# Patient Record
Sex: Male | Born: 1965 | Race: White | Hispanic: No | Marital: Married | Smoking: Current every day smoker
Health system: Southern US, Community
[De-identification: ages and names within clinical notes are randomized; demographics above are authoritative.]

## PROBLEM LIST (undated history)

## (undated) DIAGNOSIS — F329 Major depressive disorder, single episode, unspecified: Secondary | ICD-10-CM

## (undated) DIAGNOSIS — F1010 Alcohol abuse, uncomplicated: Secondary | ICD-10-CM

## (undated) DIAGNOSIS — R768 Other specified abnormal immunological findings in serum: Secondary | ICD-10-CM

## (undated) DIAGNOSIS — K219 Gastro-esophageal reflux disease without esophagitis: Secondary | ICD-10-CM

## (undated) DIAGNOSIS — F32A Depression, unspecified: Secondary | ICD-10-CM

## (undated) DIAGNOSIS — D126 Benign neoplasm of colon, unspecified: Secondary | ICD-10-CM

## (undated) DIAGNOSIS — Z800 Family history of malignant neoplasm of digestive organs: Secondary | ICD-10-CM

## (undated) HISTORY — DX: Other specified abnormal immunological findings in serum: R76.8

## (undated) HISTORY — DX: Family history of malignant neoplasm of digestive organs: Z80.0

## (undated) HISTORY — DX: Benign neoplasm of colon, unspecified: D12.6

## (undated) HISTORY — DX: Alcohol abuse, uncomplicated: F10.10

## (undated) HISTORY — PX: POLYPECTOMY: SHX149

## (undated) HISTORY — DX: Depression, unspecified: F32.A

## (undated) HISTORY — DX: Major depressive disorder, single episode, unspecified: F32.9

## (undated) HISTORY — PX: COLONOSCOPY: SHX174

---

## 1988-08-07 HISTORY — PX: APPENDECTOMY: SHX54

## 1989-08-07 HISTORY — PX: VASECTOMY: SHX75

## 2001-05-11 ENCOUNTER — Inpatient Hospital Stay (HOSPITAL_COMMUNITY): Admission: AC | Admit: 2001-05-11 | Discharge: 2001-05-13 | Payer: Self-pay

## 2001-05-11 ENCOUNTER — Encounter: Payer: Self-pay | Admitting: Emergency Medicine

## 2002-08-24 ENCOUNTER — Emergency Department (HOSPITAL_COMMUNITY): Admission: EM | Admit: 2002-08-24 | Discharge: 2002-08-25 | Payer: Self-pay | Admitting: *Deleted

## 2004-08-07 DIAGNOSIS — R768 Other specified abnormal immunological findings in serum: Secondary | ICD-10-CM

## 2004-08-07 HISTORY — DX: Other specified abnormal immunological findings in serum: R76.8

## 2006-06-06 ENCOUNTER — Ambulatory Visit: Payer: Self-pay | Admitting: Gastroenterology

## 2006-06-20 ENCOUNTER — Encounter (INDEPENDENT_AMBULATORY_CARE_PROVIDER_SITE_OTHER): Payer: Self-pay | Admitting: Specialist

## 2006-06-20 ENCOUNTER — Ambulatory Visit: Payer: Self-pay | Admitting: Gastroenterology

## 2006-06-22 ENCOUNTER — Ambulatory Visit: Payer: Self-pay | Admitting: Oncology

## 2009-02-23 ENCOUNTER — Ambulatory Visit: Payer: Self-pay | Admitting: Gastroenterology

## 2009-03-09 ENCOUNTER — Encounter: Payer: Self-pay | Admitting: Gastroenterology

## 2009-03-09 ENCOUNTER — Ambulatory Visit: Payer: Self-pay | Admitting: Gastroenterology

## 2009-03-09 HISTORY — PX: COLONOSCOPY W/ POLYPECTOMY: SHX1380

## 2009-03-11 ENCOUNTER — Encounter: Payer: Self-pay | Admitting: Gastroenterology
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
## 2011-06-28 ENCOUNTER — Emergency Department (HOSPITAL_BASED_OUTPATIENT_CLINIC_OR_DEPARTMENT_OTHER)
Admission: EM | Admit: 2011-06-28 | Discharge: 2011-06-28 | Disposition: A | Discharge status: Home / Self Care | Payer: BC Managed Care – PPO | Attending: Emergency Medicine | Admitting: Emergency Medicine

## 2011-06-28 ENCOUNTER — Encounter: Payer: Self-pay | Admitting: *Deleted

## 2011-06-28 DIAGNOSIS — K219 Gastro-esophageal reflux disease without esophagitis: Secondary | ICD-10-CM | POA: Insufficient documentation

## 2011-06-28 DIAGNOSIS — T1500XA Foreign body in cornea, unspecified eye, initial encounter: Secondary | ICD-10-CM | POA: Insufficient documentation

## 2011-06-28 DIAGNOSIS — T1590XA Foreign body on external eye, part unspecified, unspecified eye, initial encounter: Secondary | ICD-10-CM | POA: Insufficient documentation

## 2011-06-28 HISTORY — DX: Gastro-esophageal reflux disease without esophagitis: K21.9

## 2011-06-28 MED ORDER — HYDROCODONE-ACETAMINOPHEN 5-325 MG PO TABS
1.0000 | ORAL_TABLET | Freq: Once | ORAL | Status: AC
  Administered 2011-06-28: 1 via ORAL
  Filled 2011-06-28: qty 1

## 2011-06-28 MED ORDER — FLUORESCEIN SODIUM 1 MG OP STRP
ORAL_STRIP | OPHTHALMIC | Status: AC
  Filled 2011-06-28: qty 1

## 2011-06-28 MED ORDER — ERYTHROMYCIN 5 MG/GM OP OINT
TOPICAL_OINTMENT | Freq: Four times a day (QID) | OPHTHALMIC | Status: DC
  Administered 2011-06-28: 02:00:00 via OPHTHALMIC
  Filled 2011-06-28: qty 3.5

## 2011-06-28 MED ORDER — TETRACAINE HCL 0.5 % OP SOLN
OPHTHALMIC | Status: AC
  Administered 2011-06-28: 1 [drp] via OPHTHALMIC
  Filled 2011-06-28: qty 2

## 2011-06-28 MED ORDER — TETRACAINE HCL 0.5 % OP SOLN
1.0000 [drp] | Freq: Once | OPHTHALMIC | Status: AC
  Administered 2011-06-28: 1 [drp] via OPHTHALMIC

## 2011-06-28 MED ORDER — HYDROCODONE-ACETAMINOPHEN 5-325 MG PO TABS: 1.0000 | ORAL_TABLET | Freq: Four times a day (QID) | ORAL | Status: AC | PRN

## 2011-06-28 NOTE — ED Provider Notes (Addendum)
History     CSN: [id number] Arrival date & time: 06/28/2011  1:22 AM   First MD Initiated Contact with Patient 06/28/11 0127      Chief Complaint  Patient presents with  . Foreign Body in Eye    (Consider location/radiation/quality/duration/timing/severity/associated sxs/prior treatment) HPI Is a 45 year old white male who was grinding yesterday morning and felt a piece of material strike his left eye. He continues to have a foreign body sensation in that eye. There is moderate discomfort. He is here gated the eye without relief. There is no associated erythema but there is tearing. He has had similar injuries in the past.  Past Medical History  Diagnosis Date  . GERD (gastroesophageal reflux disease)     Past Surgical History  Procedure Date  . Appendectomy     History reviewed. No pertinent family history.  History  Substance Use Topics  . Smoking status: Current Everyday Smoker  . Smokeless tobacco: Not on file  . Alcohol Use: No      Review of Systems  All other systems reviewed and are negative.    Allergies  Review of patient's allergies indicates no known allergies.  Home Medications   Current Outpatient Rx  Name Route Sig Dispense Refill  . OMEPRAZOLE 10 MG PO CPDR Oral Take 10 mg by mouth daily.        BP 132/85  Pulse 64  Temp(Src) 97.4 F (36.3 C) (Oral)  Resp 17  SpO2 99%  Physical Exam General: Well-developed, well-nourished male in no acute distress; appearance consistent with age of record HENT: normocephalic, atraumatic Eyes: pupils equal round and reactive to light; extraocular muscles intact; no conjunctival injection; no hyphema; punctate foreign body left lateral cornea; no eyelid edema Neck: supple Heart: regular rate and rhythm Lungs: Normal respiratory effort and excursion Abdomen: soft; nondistended Extremities: No deformity; full range of motion Neurologic: Awake, alert and oriented; motor function intact in all  extremities and symmetric; no facial droop Skin: Warm and dry Psychiatric: Normal mood and affect    ED Course  FOREIGN BODY REMOVAL Date/Time: 06/28/2011 1:48 AM Performed by: Hanley Seamen Authorized by: Hanley Seamen Consent: Verbal consent obtained. Time out: Immediately prior to procedure a "time out" was called to verify the correct patient, procedure, equipment, support staff and site/side marked as required. Body area: eye Location details: left cornea Local anesthetic: tetracaine drops Anesthetic total: 4 drops Patient sedated: no Patient cooperative: yes Localization method: visualized and slit lamp Removal mechanism: moist cotton swab (18-gauge needle) Eye not examined with fluorescein. Residual rust ring present. Dressing: antibiotic ointment Depth: embedded Complexity: simple Objects recovered: speck of foreign matter which fragmented Patient tolerance: Patient tolerated the procedure well with no immediate complications.       MDM  Due to residual rust ring and possibility of small fragment remaining the patient will contact his ophthalmologist in the morning for further evaluation and treatment. In the meantime we'll treat with antibiotic ointment and analgesics.        Hanley Seamen, MD 06/28/11 0151  Hanley Seamen, MD 06/28/11 0157

## 2011-06-28 NOTE — ED Notes (Signed)
C/o foreign body in left eye since Tuesday morning

## 2012-01-19 ENCOUNTER — Ambulatory Visit (INDEPENDENT_AMBULATORY_CARE_PROVIDER_SITE_OTHER): Payer: BC Managed Care – PPO | Admitting: Family Medicine

## 2012-01-19 ENCOUNTER — Encounter: Payer: Self-pay | Admitting: Family Medicine

## 2012-01-19 VITALS — BP 120/80 | HR 57 | Temp 97.5°F | Wt 179.0 lb

## 2012-01-19 DIAGNOSIS — M791 Myalgia, unspecified site: Secondary | ICD-10-CM

## 2012-01-19 DIAGNOSIS — R5383 Other fatigue: Secondary | ICD-10-CM

## 2012-01-19 DIAGNOSIS — IMO0001 Reserved for inherently not codable concepts without codable children: Secondary | ICD-10-CM

## 2012-01-19 DIAGNOSIS — R358 Other polyuria: Secondary | ICD-10-CM

## 2012-01-19 DIAGNOSIS — F32A Depression, unspecified: Secondary | ICD-10-CM | POA: Insufficient documentation

## 2012-01-19 DIAGNOSIS — F329 Major depressive disorder, single episode, unspecified: Secondary | ICD-10-CM

## 2012-01-19 DIAGNOSIS — R3589 Other polyuria: Secondary | ICD-10-CM

## 2012-01-19 LAB — COMPREHENSIVE METABOLIC PANEL
ALT: 18 U/L (ref 0–53)
AST: 16 U/L (ref 0–37)
Albumin: 4.5 g/dL (ref 3.5–5.2)
Alkaline Phosphatase: 56 U/L (ref 39–117)
Glucose, Bld: 94 mg/dL (ref 70–99)
Potassium: 4.6 mEq/L (ref 3.5–5.3)
Sodium: 139 mEq/L (ref 135–145)
Total Bilirubin: 1.3 mg/dL — ABNORMAL HIGH (ref 0.3–1.2)
Total Protein: 6.6 g/dL (ref 6.0–8.3)

## 2012-01-19 LAB — TSH: TSH: 1.392 u[IU]/mL (ref 0.350–4.500)

## 2012-01-19 LAB — CK: Total CK: 92 U/L (ref 7–232)

## 2012-01-19 LAB — LIPID PANEL
Total CHOL/HDL Ratio: 5.3 Ratio
VLDL: 15 mg/dL (ref 0–40)

## 2012-01-19 LAB — CBC WITH DIFFERENTIAL/PLATELET
Basophils Relative: 1 % (ref 0–1)
Eosinophils Absolute: 0.1 10*3/uL (ref 0.0–0.7)
Eosinophils Relative: 2 % (ref 0–5)
Lymphs Abs: 2.8 10*3/uL (ref 0.7–4.0)
MCH: 30.7 pg (ref 26.0–34.0)
MCHC: 35.4 g/dL (ref 30.0–36.0)
MCV: 86.9 fL (ref 78.0–100.0)
Neutrophils Relative %: 56 % (ref 43–77)
Platelets: 267 10*3/uL (ref 150–400)
RBC: 5.5 MIL/uL (ref 4.22–5.81)

## 2012-01-19 NOTE — Assessment & Plan Note (Signed)
Sounds more like dysthymia.  He admits that much of his alcohol abuse in the past was when he was depressed and attempting to self medicate, even though he knew it would only make things worse. We'll recheck him in a week and review all labs and will address possible treatment for depression at that time.

## 2012-01-19 NOTE — Assessment & Plan Note (Signed)
Will eval for hypothyroidism, but I think these symptoms are somatic symptoms of his underlying depression. Some general labs will be done: ANA, CPK total, ESR, CBC, CMET, TSH, and fasting lipid panel.

## 2012-01-19 NOTE — Progress Notes (Signed)
Office Note 01/19/2012  CC:  Chief Complaint  Patient presents with  . Establish Care    muscle aches, legs/arms go numb during day    HPI:  Blake Taylor is a 46 y.o. White male who is here to establish care. Patient's most recent primary MD: Brooks Sailors in [employer]. Old records were reviewed (GI records in EPIC/HL) prior to or during today's visit.  Pt presents with his wife today, complains of about 8 yrs of aching in his muscles in arms and legs.  No neck pain or back pain.  No rash.  No headaches.  Says the pains are not constant, but they occur "often" and enough bother him quite a bit, although he says this is the first time he has seen a doctor for this problem.  Says he feels arms "go to sleep" when he has them folded/crossed over his chest sometimes.  Denies any pain in joints or swelling/redness of joints.  Says arms and legs feel cold much of the time.  Denies color change in extremities.   Admits to lifetime tendency towards feeling depressed "on and off".  Often feels no motivation to get up and do anything, mild irritability, some isolating behavior.  No crying spells, no concentration problems, no sleep or appetite problems.  One past attempt at treating depression was not successful and he recalls the med made him feel bad.  Past Medical History  Diagnosis Date  . GERD (gastroesophageal reflux disease)     prilosec helpful  . Adenomatous colon polyp   . Family history of colon cancer   . Depression     trial of zoloft in the past--didn't help and caused stomach ache    Past Surgical History  Procedure Date  . Appendectomy 1990  . Vasectomy 1991  . Colonoscopy w/ polypectomy     History reviewed. No pertinent family history.  History   Social History  . Marital Status: Married    Spouse Name: N/A    Number of Children: N/A  . Years of Education: N/A   Occupational History  . Not on file.   Social History Main Topics  . Smoking status: Current  Everyday Smoker  . Smokeless tobacco: Not on file  . Alcohol Use: No  . Drug Use: No  . Sexually Active:    Other Topics Concern  . Not on file   Social History Narrative   Married, 4 daughters live local.Occupation: maintenance work, temporary basis right now.Tobacco 30 pack-yr hx.  Alcohol only occasionally now, hx of alcohol abuse.  No hx of drug abuse.No exercising.  Typical american diet.    Outpatient Encounter Prescriptions as of 01/19/2012  Medication Sig Dispense Refill  . omeprazole (PRILOSEC) 10 MG capsule Take 10 mg by mouth daily.          No Known Allergies  ROS Review of Systems  Constitutional: Negative for fever, chills, appetite change and fatigue.  HENT: Negative for ear pain, congestion, sore throat, neck stiffness and dental problem.   Eyes: Negative for discharge, redness and visual disturbance.  Respiratory: Negative for cough, chest tightness, shortness of breath and wheezing.   Cardiovascular: Negative for chest pain, palpitations and leg swelling.  Gastrointestinal: Negative for nausea, vomiting, abdominal pain, diarrhea and blood in stool.  Genitourinary: Positive for urgency (for years) and frequency (for years). Negative for dysuria, hematuria, flank pain and difficulty urinating.  Musculoskeletal: Negative for back pain, joint swelling and arthralgias.  Skin: Negative for pallor and  rash.  Neurological: Negative for dizziness, speech difficulty, weakness and headaches.  Hematological: Negative for adenopathy. Does not bruise/bleed easily.  Psychiatric/Behavioral: Negative for confusion and disturbed wake/sleep cycle. The patient is not nervous/anxious.        See HPI    PE; Blood pressure 120/80, pulse 57, temperature 97.5 F (36.4 C), temperature source Temporal, weight 179 lb (81.194 kg), SpO2 96.00%. Gen: Alert, well appearing.  Patient is oriented to person, place, time, and situation. Affect: flat, only makes eye contact with me infrequently,  wife did most of the talking initially until he relaxed a bit.  Thought and speech are lucid. ENT: Ears: EACs clear, normal epithelium.  TMs with good light reflex and landmarks bilaterally.  Eyes: no injection, icteris, swelling, or exudate.  EOMI, PERRLA. Nose: no drainage or turbinate edema/swelling.  No injection or focal lesion.  Mouth: lips without lesion/swelling.  Oral mucosa pink and moist.  Dentition intact and without obvious caries or gingival swelling.  Oropharynx without erythema, exudate, or swelling.  Neck: supple/nontender.  No LAD, mass, or TM.  Carotid pulses 2+ bilaterally, without bruits. CV: RRR, no m/r/g.   LUNGS: CTA bilat, nonlabored resps, good aeration in all lung fields. ABD: soft, NT, ND, BS normal.  No hepatospenomegaly or mass.  No bruits. EXT: no clubbing, cyanosis, or edema.  No muscle tenderness to squeezing/palpation. Musculoskeletal: no joint swelling, erythema, warmth, or tenderness.  ROM of all joints intact. Neuro: CN 2-12 intact bilaterally, strength 5/5 in proximal and distal upper extremities and lower extremities bilaterally.  No sensory deficits.  No tremor.  No disdiadochokinesis.  No ataxia.  Upper extremity and lower extremity DTRs symmetric.  No pronator drift.  Pertinent labs:  none  ASSESSMENT AND PLAN:   New pt: obtain old records.  Muscle pain Will eval for hypothyroidism, but I think these symptoms are somatic symptoms of his underlying depression. Some general labs will be done: ANA, CPK total, ESR, CBC, CMET, TSH, and fasting lipid panel.  Depression Sounds more like dysthymia.  He admits that much of his alcohol abuse in the past was when he was depressed and attempting to self medicate, even though he knew it would only make things worse. We'll recheck him in a week and review all labs and will address possible treatment for depression at that time.  I think his urinary frequency and urgency are possibly slight overactive bladder that  is complicated by too much caffeine intake.  Recommended decreasing caffeine intake slowly and see how symptoms improve.  Return in about 1 week (around 01/26/2012) for f/u myalgias and depression.

## 2012-01-20 LAB — SEDIMENTATION RATE: Sed Rate: 1 mm/hr (ref 0–16)

## 2012-01-22 LAB — ANA: Anti Nuclear Antibody(ANA): NEGATIVE

## 2012-01-26 ENCOUNTER — Encounter: Payer: Self-pay | Admitting: Family Medicine

## 2012-01-26 ENCOUNTER — Ambulatory Visit (INDEPENDENT_AMBULATORY_CARE_PROVIDER_SITE_OTHER): Payer: BC Managed Care – PPO | Admitting: Family Medicine

## 2012-01-26 VITALS — BP 114/75 | HR 65 | Ht 66.0 in | Wt 177.0 lb

## 2012-01-26 DIAGNOSIS — F32A Depression, unspecified: Secondary | ICD-10-CM

## 2012-01-26 DIAGNOSIS — F329 Major depressive disorder, single episode, unspecified: Secondary | ICD-10-CM

## 2012-01-26 MED ORDER — BUPROPION HCL ER (XL) 150 MG PO TB24: 150.0000 mg | ORAL_TABLET | Freq: Every day | ORAL | Status: DC

## 2012-01-26 NOTE — Assessment & Plan Note (Signed)
I think this is the root of his myalgias--somatic complaints. Will start trial of wellbutrin XL 150mg  qAM.  Therapeutic expectations and side effect profile of medication discussed today.  Patient's questions answered. F/u 1 mo.

## 2012-01-26 NOTE — Progress Notes (Signed)
OFFICE NOTE  01/26/2012  CC:  Chief Complaint  Patient presents with  . Follow-up    myalgias-pain is the same, still hurts; arms sore with activity, muscles ache     HPI: Patient is a 46 y.o. Caucasian male who is here for 1 wk f/u for myalgias and suspicion of depression. All recent blood tests for inflammitory conditions that may cause this were negative/normal. Says energy level is down, motivation not there, concentration ok, sleep is fine, no ED or libido problems, no suicidal thinking, tends to isolate himself, feels emotionally flat.  No signif anxiety, no panic.   Pertinent PMH:  Past Medical History  Diagnosis Date  . GERD (gastroesophageal reflux disease)     prilosec helpful  . Adenomatous colon polyp   . Family history of colon cancer   . Depression     trial of zoloft in the past--didn't help and caused stomach ache    MEDS:  Outpatient Prescriptions Prior to Visit  Medication Sig Dispense Refill  . omeprazole (PRILOSEC) 10 MG capsule Take 10 mg by mouth daily.          PE: Blood pressure 114/75, pulse 65, height 5\' 6"  (1.676 m), weight 177 lb (80.287 kg). Gen: Alert, well appearing.  Patient is oriented to person, place, time, and situation. CV: RRR, no m/r/g Neuro: no tremor  IMPRESSION AND PLAN:  Depression I think this is the root of his myalgias--somatic complaints. Will start trial of wellbutrin XL 150mg  qAM.  Therapeutic expectations and side effect profile of medication discussed today.  Patient's questions answered. F/u 1 mo.     FOLLOW UP: 1 mo

## 2012-02-05 ENCOUNTER — Encounter: Payer: Self-pay | Admitting: Family Medicine

## 2012-02-26 ENCOUNTER — Encounter: Payer: Self-pay | Admitting: Family Medicine

## 2012-02-26 ENCOUNTER — Ambulatory Visit (INDEPENDENT_AMBULATORY_CARE_PROVIDER_SITE_OTHER): Payer: BC Managed Care – PPO | Admitting: Family Medicine

## 2012-02-26 VITALS — BP 121/79 | HR 70 | Ht 66.0 in | Wt 177.0 lb

## 2012-02-26 DIAGNOSIS — N451 Epididymitis: Secondary | ICD-10-CM | POA: Insufficient documentation

## 2012-02-26 DIAGNOSIS — F329 Major depressive disorder, single episode, unspecified: Secondary | ICD-10-CM

## 2012-02-26 DIAGNOSIS — N453 Epididymo-orchitis: Secondary | ICD-10-CM

## 2012-02-26 DIAGNOSIS — F32A Depression, unspecified: Secondary | ICD-10-CM

## 2012-02-26 MED ORDER — CIPROFLOXACIN HCL 500 MG PO TABS
ORAL_TABLET | ORAL | Status: DC
Start: 2012-02-26 — End: 2012-03-25

## 2012-02-26 MED ORDER — BUPROPION HCL ER (XL) 300 MG PO TB24
300.0000 mg | ORAL_TABLET | Freq: Every day | ORAL | Status: DC
Start: 2012-02-26 — End: 2012-03-25

## 2012-02-26 NOTE — Assessment & Plan Note (Signed)
Unchanged. Increase wellbutrin XL to 300mg  qd dosing.  Therapeutic expectations and side effect profile of medication discussed today.  Patient's questions answered. F/u 1 mo.

## 2012-02-26 NOTE — Assessment & Plan Note (Signed)
Cipro 500mg bid x 10d

## 2012-02-26 NOTE — Progress Notes (Signed)
OFFICE NOTE  02/26/2012  CC:  Chief Complaint  Patient presents with  . Follow-up    derpression, myalgias     HPI: Patient is a 46 y.o. Caucasian male who is here for 1 mo f/u depression, started wellbutrin 1 mo ago. Sees no difference.  No signif side effects.  Feels like mood and somatic complaints are about the same. Says testicles have been hurting constant, sharp for about a week.  Did this once a few months ago and it spontaneously resolved.  No dysuria.  Nothing makes it worse or better.  He wears boxer-briefs.  No urinary urgency or frequency.  ROS: no fevers.  Appetite unchanged.  Smoking is about the same as prior to wellbutrin.  Pertinent PMH:  Past Medical History  Diagnosis Date  . GERD (gastroesophageal reflux disease)     prilosec helpful  . Adenomatous colon polyp   . Family history of colon cancer   . Depression     trial of zoloft in the past--didn't help and caused stomach ache.  Wellbutrin 2009 --failed 6 wk trial.  . Helicobacter pylori antibody positive 08/2004   Past Surgical History  Procedure Date  . Appendectomy 1990  . Vasectomy 1991  . Colonoscopy w/ polypectomy 03/09/2009    Adenomatous polyps on 2 endoscopies in the past, most recently 03/2009.  Repeat recommended 2013 (Dr. Jarold Motto)     MEDS:  Outpatient Prescriptions Prior to Visit  Medication Sig Dispense Refill  . buPROPion (WELLBUTRIN XL) 150 MG 24 hr tablet Take 1 tablet (150 mg total) by mouth daily.  30 tablet  1  . omeprazole (PRILOSEC) 10 MG capsule Take 10 mg by mouth daily.          PE: Blood pressure 121/79, pulse 70, height 5\' 6"  (1.676 m), weight 177 lb (80.287 kg). Gen: Alert, well appearing.  Patient is oriented to person, place, time, and situation. AFFECT: pleasant, lucid thought and speech. CV: RRR, no m/r/g.   LUNGS: CTA bilat, nonlabored resps, good aeration in all lung fields.  GU: testes normal, back of right testicle is tender over the epididymus.  Left testicle  nontender.   IMPRESSION AND PLAN:  Depression Unchanged. Increase wellbutrin XL to 300mg  qd dosing.  Therapeutic expectations and side effect profile of medication discussed today.  Patient's questions answered. F/u 1 mo.  Epididymitis, right Cipro 500mg  bid x 10d.     FOLLOW UP: 1 mo

## 2012-03-20 ENCOUNTER — Encounter: Payer: Self-pay | Admitting: Gastroenterology

## 2012-03-25 ENCOUNTER — Ambulatory Visit (INDEPENDENT_AMBULATORY_CARE_PROVIDER_SITE_OTHER): Payer: BC Managed Care – PPO | Admitting: Family Medicine

## 2012-03-25 ENCOUNTER — Encounter: Payer: Self-pay | Admitting: Family Medicine

## 2012-03-25 VITALS — BP 117/77 | HR 74 | Temp 98.2°F | Ht 66.0 in | Wt 173.0 lb

## 2012-03-25 DIAGNOSIS — N451 Epididymitis: Secondary | ICD-10-CM

## 2012-03-25 DIAGNOSIS — N453 Epididymo-orchitis: Secondary | ICD-10-CM

## 2012-03-25 DIAGNOSIS — F329 Major depressive disorder, single episode, unspecified: Secondary | ICD-10-CM

## 2012-03-25 DIAGNOSIS — F32A Depression, unspecified: Secondary | ICD-10-CM

## 2012-03-25 MED ORDER — CITALOPRAM HYDROBROMIDE 20 MG PO TABS: 20.0000 mg | ORAL_TABLET | Freq: Every day | ORAL | Status: DC

## 2012-03-25 NOTE — Assessment & Plan Note (Signed)
Failed wellbutrin XL for 2mo trial--even at max dose. Most of his depression/anxiety is situational (related to his current lack of a job). We discussed options and still feel like a trial of different med is reasonable. Start citalopram 20mg  qd.  Therapeutic expectations and side effect profile of medication discussed today.  Patient's questions answered. F/u 6 wks.

## 2012-03-25 NOTE — Progress Notes (Signed)
OFFICE NOTE  03/25/2012  CC:  Chief Complaint  Patient presents with  . Follow-up    1 month     HPI: Patient is a 46 y.o. Caucasian male who is here for 1 mo f/u depression and epididymitis. He is s/p cipro x 2 wks. He has been on wellbutrin 300mg  XL x 66mo and has been on this med for depression for a total of 2 months. Says he really doesn't feel much different.  Says his situation of being out of work is his main problem.   Prefers to be alone.  No crying spells.  No SI or HI.  Sleep is pretty good.  Appetite is fair. Feels like he's angered easily, loses temper some but no physical altercations. Says all of his testicular sx's have resolved since finishing abx.  Pertinent PMH:  Past Medical History  Diagnosis Date  . GERD (gastroesophageal reflux disease)     prilosec helpful  . Adenomatous colon polyp   . Family history of colon cancer   . Depression     trial of zoloft in the past--didn't help and caused stomach ache.  Wellbutrin 2009 --failed 6 wk trial.  . Helicobacter pylori antibody positive 08/2004   Past Surgical History  Procedure Date  . Appendectomy 1990  . Vasectomy 1991  . Colonoscopy w/ polypectomy 03/09/2009    Adenomatous polyps on 2 endoscopies in the past, most recently 03/2009.  Repeat recommended 2013 (Dr. Jarold Motto)    MEDS:  Outpatient Prescriptions Prior to Visit  Medication Sig Dispense Refill  . buPROPion (WELLBUTRIN XL) 300 MG 24 hr tablet Take 1 tablet (300 mg total) by mouth daily.  30 tablet  1  . omeprazole (PRILOSEC) 10 MG capsule Take 10 mg by mouth daily.        Marland Kitchen ibuprofen (ADVIL,MOTRIN) 200 MG tablet Take 400 mg by mouth every morning.      . ciprofloxacin (CIPRO) 500 MG tablet 1 tab po bid x 10 days  20 tablet  0    PE: Blood pressure 117/77, pulse 74, temperature 98.2 F (36.8 C), temperature source Temporal, height 5\' 6"  (1.676 m), weight 173 lb (78.472 kg), SpO2 97.00%. Gen: Alert, well appearing.  Patient is oriented to  person, place, time, and situation. Wt Readings from Last 2 Encounters:  03/25/12 173 lb (78.472 kg)  02/26/12 177 lb (80.287 kg)    Gen: alert, oriented x 4, affect pleasant.  Lucid thinking and conversation noted. HEENT: PERRLA, EOMI.   Neck: no LAD, mass, or thyromegaly.   Carotids 2+, no bruits. CV: RRR, no m/r/g LUNGS: CTA bilat, nonlabored. NEURO: no tremor or tics noted on observation.  Coordination intact. CN 2-12 grossly intact bilaterally, strength 5/5 in all extremeties.  No ataxia.    IMPRESSION AND PLAN:  Depression Failed wellbutrin XL for 83mo trial--even at max dose. Most of his depression/anxiety is situational (related to his current lack of a job). We discussed options and still feel like a trial of different med is reasonable. Start citalopram 20mg  qd.  Therapeutic expectations and side effect profile of medication discussed today.  Patient's questions answered. F/u 6 wks.  Epididymitis, right Resolved.     FOLLOW UP: 6 wks

## 2012-03-25 NOTE — Assessment & Plan Note (Signed)
Resolved

## 2012-05-03 ENCOUNTER — Encounter: Payer: Self-pay | Admitting: Family Medicine

## 2012-05-03 ENCOUNTER — Ambulatory Visit (INDEPENDENT_AMBULATORY_CARE_PROVIDER_SITE_OTHER): Payer: BC Managed Care – PPO | Admitting: Family Medicine

## 2012-05-03 VITALS — BP 112/73 | HR 57 | Ht 66.0 in | Wt 174.0 lb

## 2012-05-03 DIAGNOSIS — F32A Depression, unspecified: Secondary | ICD-10-CM

## 2012-05-03 DIAGNOSIS — F329 Major depressive disorder, single episode, unspecified: Secondary | ICD-10-CM

## 2012-05-03 MED ORDER — CITALOPRAM HYDROBROMIDE 20 MG PO TABS: 20.0000 mg | ORAL_TABLET | Freq: Every day | ORAL | Status: DC

## 2012-05-03 NOTE — Assessment & Plan Note (Signed)
Much improved. Continue citalopram 20mg  qd. He'll call or return if he has any problems. Otherwise, f/u depression in 3 mo.

## 2012-05-03 NOTE — Addendum Note (Signed)
Addended by: Francee Piccolo C on: 05/03/2012 11:25 AM   Modules accepted: Orders

## 2012-05-03 NOTE — Progress Notes (Signed)
OFFICE NOTE  05/03/2012  CC:  Chief Complaint  Patient presents with  . Follow-up    depression, doing well; declines flu shot     HPI: Patient is a 46 y.o. Caucasian male who is here for 1 mo f/u depression, started citalopram 20mg  qd last visit. He is doing well, has a part time job seeding yards, feels like the med is helping and he has no side effects.   Pertinent PMH:  Past Medical History  Diagnosis Date  . GERD (gastroesophageal reflux disease)     prilosec helpful  . Adenomatous colon polyp   . Family history of colon cancer   . Depression     trial of zoloft in the past--didn't help and caused stomach ache.  Wellbutrin 2009 --failed 6 wk trial.  . Helicobacter pylori antibody positive 08/2004    MEDS:  Outpatient Prescriptions Prior to Visit  Medication Sig Dispense Refill  . citalopram (CELEXA) 20 MG tablet Take 1 tablet (20 mg total) by mouth daily.  20 tablet  1  . ibuprofen (ADVIL,MOTRIN) 200 MG tablet Take 400 mg by mouth every morning.      Marland Kitchen omeprazole (PRILOSEC) 10 MG capsule Take 10 mg by mouth daily.          PE: Blood pressure 112/73, pulse 57, height 5\' 6"  (1.676 m), weight 174 lb (78.926 kg). Gen: Alert, well appearing.  Patient is oriented to person, place, time, and situation. AFFECT: pleasant, lucid thought and speech. CV: RRR, no m/r/g.   LUNGS: CTA bilat, nonlabored resps, good aeration in all lung fields.   IMPRESSION AND PLAN:  Depression Much improved. Continue citalopram 20mg  qd. He'll call or return if he has any problems. Otherwise, f/u depression in 3 mo.   An After Visit Summary was printed and given to the patient.   FOLLOW UP: 70mo

## 2012-07-03 ENCOUNTER — Encounter: Payer: Self-pay | Admitting: Gastroenterology

## 2012-08-02 ENCOUNTER — Encounter: Payer: Self-pay | Admitting: Family Medicine

## 2012-08-02 ENCOUNTER — Ambulatory Visit (INDEPENDENT_AMBULATORY_CARE_PROVIDER_SITE_OTHER): Payer: BC Managed Care – PPO | Admitting: Family Medicine

## 2012-08-02 VITALS — BP 123/77 | HR 70 | Temp 98.0°F | Resp 16 | Ht 66.0 in | Wt 182.0 lb

## 2012-08-02 DIAGNOSIS — F329 Major depressive disorder, single episode, unspecified: Secondary | ICD-10-CM

## 2012-08-02 DIAGNOSIS — K219 Gastro-esophageal reflux disease without esophagitis: Secondary | ICD-10-CM | POA: Insufficient documentation

## 2012-08-02 DIAGNOSIS — F32A Depression, unspecified: Secondary | ICD-10-CM

## 2012-08-02 MED ORDER — OMEPRAZOLE 10 MG PO CPDR: 10.0000 mg | DELAYED_RELEASE_CAPSULE | Freq: Every day | ORAL | Status: DC

## 2012-08-02 MED ORDER — CITALOPRAM HYDROBROMIDE 20 MG PO TABS: 20.0000 mg | ORAL_TABLET | Freq: Every day | ORAL | Status: DC

## 2012-08-02 NOTE — Assessment & Plan Note (Signed)
In remission. Continue citalopram 20mg  qd at least 6 more months. F/u 59mo in office.

## 2012-08-02 NOTE — Assessment & Plan Note (Signed)
Encouraged dietary adjustments. Rx for prilosec 10mg  qd, #90, RF x 4 given today.

## 2012-08-02 NOTE — Progress Notes (Signed)
OFFICE NOTE  08/02/2012  CC:  Chief Complaint  Patient presents with  . Follow-up    3 month  . Depression     HPI: Patient is a 46 y.o. Caucasian male who is here for 3 mo f/u depression. He has been on citalopram 20 mg for 4-5  mo now.  Now has his old job back (full time) and he's relieved about this. Says he really doesn't feel any depression anymore at this time--full remission.  Side effects: none.  He was having some worsened urinary urgency and occ easy losing his urine.  NO nocturia or dysuria.  Drinks a lot of coffee in morning.  No anal/genital pains.  Last couple of weeks these urinary c/o have been getting better. He is set for repeat colonoscopy next month to f/u adenomatous polyps.  Says his GER is fine as long as he takes prilosec OTC 10mg  daily.     Pertinent PMH:  Past Medical History  Diagnosis Date  . GERD (gastroesophageal reflux disease)     prilosec helpful  . Adenomatous colon polyp   . Family history of colon cancer   . Depression     trial of zoloft in the past--didn't help and caused stomach ache.  Wellbutrin 2009 --failed 6 wk trial.  . Helicobacter pylori antibody positive 08/2004   Past surgical, social, and family history reviewed and no changes noted since last office visit.  MEDS:  Outpatient Prescriptions Prior to Visit  Medication Sig Dispense Refill  . citalopram (CELEXA) 20 MG tablet Take 1 tablet (20 mg total) by mouth daily.  30 tablet  3  . ibuprofen (ADVIL,MOTRIN) 200 MG tablet Take 400 mg by mouth every morning.      Marland Kitchen omeprazole (PRILOSEC) 10 MG capsule Take 10 mg by mouth daily.        Last reviewed on 08/02/2012  8:16 AM by Jeoffrey Massed, MD  PE: Blood pressure 123/77, pulse 70, temperature 98 F (36.7 C), temperature source Oral, resp. rate 16, height 5\' 6"  (1.676 m), weight 182 lb (82.555 kg), SpO2 98.00%. Wt Readings from Last 2 Encounters:  08/02/12 182 lb (82.555 kg)  05/03/12 174 lb (78.926 kg)   Gen: alert,  oriented x 4, affect pleasant.  Lucid thinking and conversation noted. HEENT: PERRLA, EOMI.   Neck: no LAD, mass, or thyromegaly. CV: RRR, no m/r/g LUNGS: CTA bilat, nonlabored. NEURO: no tremor or tics noted on observation.  Coordination intact. CN 2-12 grossly intact bilaterally, strength 5/5 in all extremeties.  No ataxia.  LAB: none  IMPRESSION AND PLAN:  Depression In remission. Continue citalopram 20mg  qd at least 6 more months. F/u 68mo in office.  GERD (gastroesophageal reflux disease) Encouraged dietary adjustments. Rx for prilosec 10mg  qd, #90, RF x 4 given today.   He declined flu vaccine again today.  An After Visit Summary was printed and given to the patient.  FOLLOW UP: 68mo

## 2012-08-14 ENCOUNTER — Ambulatory Visit (AMBULATORY_SURGERY_CENTER): Payer: BC Managed Care – PPO | Admitting: *Deleted

## 2012-08-14 VITALS — Ht 66.0 in | Wt 181.0 lb

## 2012-08-14 DIAGNOSIS — Z800 Family history of malignant neoplasm of digestive organs: Secondary | ICD-10-CM

## 2012-08-14 DIAGNOSIS — Z1211 Encounter for screening for malignant neoplasm of colon: Secondary | ICD-10-CM

## 2012-08-14 DIAGNOSIS — Z86010 Personal history of colonic polyps: Secondary | ICD-10-CM

## 2012-08-14 MED ORDER — MOVIPREP 100 G PO SOLR: 1.0000 | Freq: Once | ORAL | Status: DC

## 2012-08-14 NOTE — Progress Notes (Signed)
No egg or soy allergy per pt. ewm 

## 2012-08-15 ENCOUNTER — Encounter: Payer: Self-pay | Admitting: Gastroenterology

## 2012-08-28 ENCOUNTER — Ambulatory Visit (AMBULATORY_SURGERY_CENTER): Payer: BC Managed Care – PPO | Admitting: Gastroenterology

## 2012-08-28 ENCOUNTER — Encounter: Payer: Self-pay | Admitting: Gastroenterology

## 2012-08-28 VITALS — BP 107/81 | HR 54 | Temp 96.8°F | Resp 19 | Ht 66.0 in | Wt 181.0 lb

## 2012-08-28 DIAGNOSIS — D126 Benign neoplasm of colon, unspecified: Secondary | ICD-10-CM

## 2012-08-28 DIAGNOSIS — Z1211 Encounter for screening for malignant neoplasm of colon: Secondary | ICD-10-CM

## 2012-08-28 DIAGNOSIS — Z86010 Personal history of colonic polyps: Secondary | ICD-10-CM

## 2012-08-28 DIAGNOSIS — Z800 Family history of malignant neoplasm of digestive organs: Secondary | ICD-10-CM

## 2012-08-28 MED ORDER — SODIUM CHLORIDE 0.9 % IV SOLN: 500.0000 mL | INTRAVENOUS | Status: DC

## 2012-08-28 NOTE — Progress Notes (Signed)
Patient did not experience any of the following events: a burn prior to discharge; a fall within the facility; wrong site/side/patient/procedure/implant event; or a hospital transfer or hospital admission upon discharge from the facility. (G8907) Patient did not have preoperative order for IV antibiotic SSI prophylaxis. (G8918)  

## 2012-08-28 NOTE — Patient Instructions (Addendum)
Discharge instructions given with verbal understanding. Handout on polyps given. Resume previous medications. YOU HAD AN ENDOSCOPIC PROCEDURE TODAY AT THE [facility]: Refer to the procedure report that was given to you for any specific questions about what was found during the examination.  If the procedure report does not answer your questions, please call your gastroenterologist to clarify.  If you requested that your care partner not be given the details of your procedure findings, then the procedure report has been included in a sealed envelope for you to review at your convenience later.  YOU SHOULD EXPECT: Some feelings of bloating in the abdomen. Passage of more gas than usual.  Walking can help get rid of the air that was put into your GI tract during the procedure and reduce the bloating. If you had a lower endoscopy (such as a colonoscopy or flexible sigmoidoscopy) you may notice spotting of blood in your stool or on the toilet paper. If you underwent a bowel prep for your procedure, then you may not have a normal bowel movement for a few days.  DIET: Your first meal following the procedure should be a light meal and then it is ok to progress to your normal diet.  A half-sandwich or bowl of soup is an example of a good first meal.  Heavy or fried foods are harder to digest and may make you feel nauseous or bloated.  Likewise meals heavy in dairy and vegetables can cause extra gas to form and this can also increase the bloating.  Drink plenty of fluids but you should avoid alcoholic beverages for 24 hours.  ACTIVITY: Your care partner should take you home directly after the procedure.  You should plan to take it easy, moving slowly for the rest of the day.  You can resume normal activity the day after the procedure however you should NOT DRIVE or use heavy machinery for 24 hours (because of the sedation medicines used during the test).    SYMPTOMS TO REPORT IMMEDIATELY: A  gastroenterologist can be reached at any hour.  During normal business hours, 8:30 AM to 5:00 PM Monday through Friday, call [phone].  After hours and on weekends, please call the GI answering service at [phone] who will take a message and have the physician on call contact you.   Following lower endoscopy (colonoscopy or flexible sigmoidoscopy):  Excessive amounts of blood in the stool  Significant tenderness or worsening of abdominal pains  Swelling of the abdomen that is new, acute  Fever of 100F or higher  FOLLOW UP: If any biopsies were taken you will be contacted by phone or by letter within the next 1-3 weeks.  Call your gastroenterologist if you have not heard about the biopsies in 3 weeks.  Our staff will call the home number listed on your records the next business day following your procedure to check on you and address any questions or concerns that you may have at that time regarding the information given to you following your procedure. This is a courtesy call and so if there is no answer at the home number and we have not heard from you through the emergency physician on call, we will assume that you have returned to your regular daily activities without incident.  SIGNATURES/CONFIDENTIALITY: You and/or your care partner have signed paperwork which will be entered into your electronic medical record.  These signatures attest to the fact that that the information above on your After Visit Summary has   been reviewed and is understood.  Full responsibility of the confidentiality of this discharge information lies with you and/or your care-partner. 

## 2012-08-28 NOTE — Progress Notes (Signed)
Called to room to assist during endoscopic procedure.  Patient ID and intended procedure confirmed with present staff. Received instructions for my participation in the procedure from the performing physician.  

## 2012-08-28 NOTE — Op Note (Addendum)
[facility] [address] [place], [address]   COLONOSCOPY PROCEDURE REPORT  PATIENT:   Blake Taylor, Blake Taylor  MR#: [id number] BIRTHDATE: 01-Jan-1966 , 46  yrs. old GENDER: Male ENDOSCOPIST: Mardella Layman, MD, [facility] REFERRED BY: PROCEDURE DATE:  08/28/2012 PROCEDURE:   Colonoscopy with snare polypectomy ASA CLASS:   Class I INDICATIONS:Patient's personal history of adenomatous colon polyps.  MEDICATIONS: propofol (Diprivan) 200mg  IV  DESCRIPTION OF PROCEDURE:   After the risks and benefits and of the procedure were explained, informed consent was obtained.  A digital rectal exam revealed no abnormalities of the rectum.    The endoscope was introduced through the anus and advanced to the cecum, which was identified by both the appendix and ileocecal valve .  The quality of the prep was excellent, using MoviPrep . The instrument was then slowly withdrawn as the colon was fully examined.     COLON FINDINGS: Two small smooth sessile polyps were found in the sigmoid colon.  Polypectomies were performed with a cold snare. The resection was complete and the polyp tissue was partially retrieved.   The colon was otherwise normal.  There was no diverticulosis, inflammation, polyps or cancers unless previously stated.     Retroflexed views revealed no abnormalities.     The scope was then withdrawn from the patient and the procedure completed.  COMPLICATIONS: There were no complications. ENDOSCOPIC IMPRESSION: 1.   Two small sessile polyps were found in the sigmoid colon; polypectomy was performed with a cold snare ,hx. of polyos and ++ FH of colon ca. 2.   The colon was otherwise normal  RECOMMENDATIONS: 1.  Await pathology results 2.  Repeat Colonoscopy in 5 years.   REPEAT EXAM:  cc:  _______________________________ eSignedMardella Layman, MD, [facility] 08/28/2012 11:41 AM Revised: 08/28/2012 11:41 AM

## 2012-08-29 ENCOUNTER — Telehealth: Payer: Self-pay | Admitting: *Deleted

## 2012-08-29 NOTE — Telephone Encounter (Signed)
  Follow up Call-  Call back number 08/28/2012  Post procedure Call Back phone  # [phone]  Permission to leave phone message Yes     Patient questions:  Do you have a fever, pain , or abdominal swelling? no Pain Score  0 *  Have you tolerated food without any problems? yes  Have you been able to return to your normal activities? yes  Do you have any questions about your discharge instructions: Diet   no Medications  no Follow up visit  no  Do you have questions or concerns about your Care? no  Actions: * If pain score is 4 or above: No action needed, pain <4.

## 2012-09-02 ENCOUNTER — Encounter: Payer: Self-pay | Admitting: Gastroenterology

## 2012-09-02 ENCOUNTER — Other Ambulatory Visit: Payer: Self-pay | Admitting: Family Medicine

## 2012-09-03 NOTE — Telephone Encounter (Signed)
eScribe request for refill on CITALOPRAM Last filled - 08/02/12, #90 X 1 DAW Last seen on - 08/02/12 Follow up - 01/2013 Message left for pt to return call.  Last RX sent was DAW and should be at pharmacy.  Asked pt to return call to confirm he does want brand which may have a higher copay.

## 2012-09-04 NOTE — Telephone Encounter (Signed)
Wife called on 1/28 @ 8:10am and left message on my voicemail.  I was not at my desk on that day and did not know she had called.  2nd message left for Blake to please call office today.  John at [facility] will process refill for generic citalopram today that is on hold.

## 2013-01-31 ENCOUNTER — Ambulatory Visit: Payer: BC Managed Care – PPO | Admitting: Family Medicine

## 2013-02-04 ENCOUNTER — Ambulatory Visit (INDEPENDENT_AMBULATORY_CARE_PROVIDER_SITE_OTHER): Payer: BC Managed Care – PPO | Admitting: Family Medicine

## 2013-02-04 ENCOUNTER — Encounter: Payer: Self-pay | Admitting: Family Medicine

## 2013-02-04 VITALS — BP 134/81 | HR 58 | Temp 97.9°F | Resp 16 | Ht 66.0 in | Wt 180.0 lb

## 2013-02-04 DIAGNOSIS — K219 Gastro-esophageal reflux disease without esophagitis: Secondary | ICD-10-CM

## 2013-02-04 DIAGNOSIS — F329 Major depressive disorder, single episode, unspecified: Secondary | ICD-10-CM

## 2013-02-04 DIAGNOSIS — F32A Depression, unspecified: Secondary | ICD-10-CM

## 2013-02-04 DIAGNOSIS — G2581 Restless legs syndrome: Secondary | ICD-10-CM

## 2013-02-04 MED ORDER — OMEPRAZOLE 10 MG PO CPDR: 10.0000 mg | DELAYED_RELEASE_CAPSULE | Freq: Every day | ORAL | Status: DC

## 2013-02-04 MED ORDER — CITALOPRAM HYDROBROMIDE 40 MG PO TABS: 40.0000 mg | ORAL_TABLET | Freq: Every day | ORAL | Status: DC

## 2013-02-04 MED ORDER — CLONAZEPAM 1 MG PO TABS: ORAL_TABLET | ORAL | Status: DC

## 2013-02-04 NOTE — Assessment & Plan Note (Addendum)
Trial of clonazepam 1mg  tab, 1/2-1 tab po prior to bedtime. Last Hb was normal 1 yr ago when he already these sx's, but since they are worsening I will repeat CBC and obtain ferritin level at next f/u in 61mo to r/o iron def as the cause of his RLS. Denies melena or hematochezia.  Diet is normal and should not lead to iron def in him.  If iron does end up being low, will do hemoccults and also check for celiac dz as cause.

## 2013-02-04 NOTE — Assessment & Plan Note (Signed)
With anxiety. Not ideally controlled. Will increase citalopram to 40mg  qd.

## 2013-02-04 NOTE — Progress Notes (Signed)
OFFICE NOTE  02/04/2013  CC:  Chief Complaint  Patient presents with  . Follow-up    anxiety / heartburn     HPI: Patient is a 47 y.o. Caucasian male who is here for 6 month f/u GERD and depression/anxiety.   Lately, since working 3rd shift over the last month or so he has felt the need to take his citalopram twice a day.  No significant depression but feels more keyed up and anxious. No SI or HI.  No panic attacks.  Says use of OTC prilosec 10mg  daily controls his GERD very well.  Says he can eat whatever he wants as long as he takes this med.  Describes an irritable sensation in lower legs for over a year, maybe longer, but getting worse the last few months.  Says he feels like fronts of legs are hot sometimes and sometimes a bit cold.  This is accompanied by an irresistible need to move his legs.  Worse when sitting at desk for a while and while lying in bed trying to sleep.  Getting up and walking around or simply moving his legs alleviates the symptoms at least temporarily.  Says he has never been dx'd with RLS or treated for this in the past.   Pertinent PMH:  Past Medical History  Diagnosis Date  . GERD (gastroesophageal reflux disease)     prilosec helpful  . Adenomatous colon polyp   . Family history of colon cancer   . Depression     trial of zoloft in the past--didn't help and caused stomach ache.  Wellbutrin 2009 --failed 6 wk trial.  . Helicobacter pylori antibody positive 08/2004   Past Surgical History  Procedure Laterality Date  . Appendectomy  1990  . Vasectomy  1991  . Colonoscopy w/ polypectomy  03/09/2009    Adenomatous polyps on 2 endoscopies in the past, most recently 03/2009.  Repeat recommended 2013 (Dr. Jarold Motto)  . Polypectomy    . Colonoscopy     Past family and social history reviewed and there are no changes since the patient's last office visit with me.  MEDS:  Outpatient Prescriptions Prior to Visit  Medication Sig Dispense Refill  .  citalopram (CELEXA) 20 MG tablet Take 1 tablet (20 mg total) by mouth daily.  90 tablet  1  . ibuprofen (ADVIL,MOTRIN) 200 MG tablet Take 400 mg by mouth every morning.      Marland Kitchen omeprazole (PRILOSEC) 10 MG capsule Take 1 capsule (10 mg total) by mouth daily.  90 capsule  3   No facility-administered medications prior to visit.    PE: Blood pressure 134/81, pulse 58, temperature 97.9 F (36.6 C), temperature source Oral, resp. rate 16, height 5\' 6"  (1.676 m), weight 180 lb (81.647 kg), SpO2 97.00%. Gen: Alert, well appearing.  Patient is oriented to person, place, time, and situation. AFFECT: pleasant, lucid thought and speech. LEGS: no edema, no rash or discoloration, no asymmetry.  IMPRESSION AND PLAN:  Restless legs syndrome Trial of clonazepam 1mg  tab, 1/2-1 tab po prior to bedtime. Last Hb was normal 1 yr ago when he already these sx's, but since they are worsening I will repeat CBC and obtain ferritin level at next f/u in 83mo to r/o iron def as the cause of his RLS. Denies melena or hematochezia.  Diet is normal and should not lead to iron def in him.  If iron does end up being low, will do hemoccults and also check for celiac dz as cause.  Depression With anxiety. Not ideally controlled. Will increase citalopram to 40mg  qd.  GERD (gastroesophageal reflux disease) Doing fine on omeprazole 10mg  qd--rx sent to pharmacy for this today.   An After Visit Summary was printed and given to the patient.  FOLLOW UP: 72mo for CPE

## 2013-02-04 NOTE — Patient Instructions (Addendum)
Start with 1/2 of clonazepam prior to sleep ---may increase to whole tab if not helpful after 3 nights of this dosing. Call if whole tab is not helping restless legs significantly in 2 weeks of daily use.

## 2013-02-04 NOTE — Assessment & Plan Note (Signed)
Doing fine on omeprazole 10mg  qd--rx sent to pharmacy for this today.

## 2013-02-12 ENCOUNTER — Telehealth: Payer: Self-pay | Admitting: Family Medicine

## 2013-02-12 MED ORDER — CLONAZEPAM 1 MG PO TABS: ORAL_TABLET | ORAL | Status: DC

## 2013-02-12 NOTE — Telephone Encounter (Signed)
The clonazepam was a new med I started him on 02/04/13 and I printed the rx and gave it to him when he left the office.

## 2013-02-12 NOTE — Telephone Encounter (Signed)
Did this patient receive this rx when they were here on the first? Reprinted rx.

## 2013-02-13 NOTE — Telephone Encounter (Signed)
Patient returned call, patient confirmed that he received rx at [facility].

## 2013-02-13 NOTE — Telephone Encounter (Signed)
Left message on home vm for patient to return call.

## 2013-06-11 ENCOUNTER — Encounter (HOSPITAL_COMMUNITY): Payer: Self-pay | Admitting: Emergency Medicine

## 2013-06-11 ENCOUNTER — Emergency Department (HOSPITAL_COMMUNITY)
Admission: EM | Admit: 2013-06-11 | Discharge: 2013-06-12 | Disposition: A | Discharge status: Home / Self Care | Payer: BC Managed Care – PPO | Attending: Emergency Medicine | Admitting: Emergency Medicine

## 2013-06-11 DIAGNOSIS — F1010 Alcohol abuse, uncomplicated: Secondary | ICD-10-CM | POA: Insufficient documentation

## 2013-06-11 DIAGNOSIS — R279 Unspecified lack of coordination: Secondary | ICD-10-CM | POA: Insufficient documentation

## 2013-06-11 DIAGNOSIS — F911 Conduct disorder, childhood-onset type: Secondary | ICD-10-CM | POA: Insufficient documentation

## 2013-06-11 DIAGNOSIS — R4182 Altered mental status, unspecified: Secondary | ICD-10-CM | POA: Insufficient documentation

## 2013-06-11 DIAGNOSIS — F10929 Alcohol use, unspecified with intoxication, unspecified: Secondary | ICD-10-CM

## 2013-06-11 DIAGNOSIS — F17200 Nicotine dependence, unspecified, uncomplicated: Secondary | ICD-10-CM | POA: Insufficient documentation

## 2013-06-11 LAB — URINALYSIS, ROUTINE W REFLEX MICROSCOPIC
Bilirubin Urine: NEGATIVE
Glucose, UA: NEGATIVE mg/dL
Hgb urine dipstick: NEGATIVE
Ketones, ur: NEGATIVE mg/dL
Nitrite: NEGATIVE
Protein, ur: NEGATIVE mg/dL
pH: 6 (ref 5.0–8.0)

## 2013-06-11 LAB — BASIC METABOLIC PANEL
BUN: 10 mg/dL (ref 6–23)
Chloride: 103 mEq/L (ref 96–112)
Creatinine, Ser: 0.89 mg/dL (ref 0.50–1.35)
GFR calc Af Amer: >90 mL/min (ref >90)
GFR calc non Af Amer: >90 mL/min (ref >90)

## 2013-06-11 LAB — CBC WITH DIFFERENTIAL/PLATELET
Basophils Relative: 1 % (ref 0–1)
Eosinophils Absolute: 0.1 10*3/uL (ref 0.0–0.7)
HCT: 48.7 % (ref 39.0–52.0)
Hemoglobin: 17.9 g/dL — ABNORMAL HIGH (ref 13.0–17.0)
Lymphocytes Relative: 27 % (ref 12–46)
MCH: 32.4 pg (ref 26.0–34.0)
MCHC: 36.8 g/dL — ABNORMAL HIGH (ref 30.0–36.0)
Monocytes Absolute: 0.9 10*3/uL (ref 0.1–1.0)
Monocytes Relative: 8 % (ref 3–12)
Neutro Abs: 6.9 10*3/uL (ref 1.7–7.7)

## 2013-06-11 LAB — RAPID URINE DRUG SCREEN, HOSP PERFORMED
Amphetamines: NOT DETECTED
Barbiturates: NOT DETECTED
Benzodiazepines: NOT DETECTED
Tetrahydrocannabinol: NOT DETECTED

## 2013-06-11 LAB — ETHANOL: Alcohol, Ethyl (B): 310 mg/dL — ABNORMAL HIGH (ref 0–11)

## 2013-06-11 MED ORDER — LORAZEPAM 2 MG/ML IJ SOLN
2.0000 mg | Freq: Once | INTRAMUSCULAR | Status: AC
  Administered 2013-06-11: 2 mg via INTRAMUSCULAR
  Filled 2013-06-11: qty 1

## 2013-06-11 MED ORDER — STERILE WATER FOR INJECTION IJ SOLN
INTRAMUSCULAR | Status: AC
  Administered 2013-06-11: 22:00:00
  Filled 2013-06-11: qty 10

## 2013-06-11 MED ORDER — ZIPRASIDONE MESYLATE 20 MG IM SOLR
20.0000 mg | Freq: Once | INTRAMUSCULAR | Status: AC
  Administered 2013-06-11: 20 mg via INTRAMUSCULAR
  Filled 2013-06-11: qty 20

## 2013-06-11 NOTE — ED Notes (Addendum)
Wife requesting to speak to RN. sts requesting update- and was updated and sts pt initially called out for Chest pain sts pt has been having intermittent CP x3 days. Initially pt thought it was r/t hx of GERD. Pt takes daily protonix but did not have relief. Wife was not with pt when pt called 911 but sts when she got home EMS was at house checking pt out for chest pain.   Pt denied pain to RN while restrained. Dr. Effie Shy made aware and EKG is being obtained by [facility]

## 2013-06-11 NOTE — ED Notes (Signed)
Pt. arrived with [employer] officers handcuffed intoxicated with ETOH , poor historian / unable to focus during encounter at triage , respirations unlabored .

## 2013-06-12 NOTE — ED Notes (Signed)
Wife Lisabeth Register Number: [phone]

## 2013-06-12 NOTE — ED Notes (Addendum)
Marylene Land (Daughter) [phone]

## 2013-06-12 NOTE — ED Notes (Signed)
Pt ambulated to RR with difficulty or incident.

## 2013-06-12 NOTE — ED Provider Notes (Signed)
CSN: [id number]     Arrival date & time 06/11/13  2126 History   First MD Initiated Contact with Patient 06/11/13 2208     Chief Complaint  Patient presents with  . Alcohol Intoxication   (Consider location/radiation/quality/duration/timing/severity/associated sxs/prior Treatment) HPI Comments: Blake Taylor is a 47 y.o. male who presents by law enforcement handcuffed, for intoxication. He was found lying down outside. EMS was summoned, but were unable to bring him here. He is unable to give history. There are 6 please officers with him to restrain him.  Level V caveat: Altered mental status   Patient is a 47 y.o. male presenting with intoxication. The history is provided by the patient and the police.  Alcohol Intoxication    History reviewed. No pertinent past medical history. History reviewed. No pertinent past surgical history. No family history on file. History  Substance Use Topics  . Smoking status: Current Every Day Smoker  . Smokeless tobacco: Not on file  . Alcohol Use: Yes    Review of Systems  Unable to perform ROS   Allergies  Review of patient's allergies indicates not on file.  Home Medications  No current outpatient prescriptions on file. BP 130/66  Pulse 114  Temp(Src) 96.7 F (35.9 C) (Axillary)  Resp 27  SpO2 95% Physical Exam  Nursing note and vitals reviewed. Constitutional: He appears well-developed.  Unkempt  HENT:  Head: Normocephalic and atraumatic.  Right Ear: External ear normal.  Left Ear: External ear normal.  Eyes: Conjunctivae and EOM are normal. Pupils are equal, round, and reactive to light.  Neck: Normal range of motion and phonation normal. Neck supple.  Cardiovascular: Normal rate.   Pulmonary/Chest: Effort normal. He exhibits no bony tenderness.  Abdominal: Normal appearance.  Musculoskeletal: Normal range of motion.  Neurological: He is alert. No sensory deficit. He exhibits normal muscle tone.  Dysarthric. Moderate  ataxia  Skin: Skin is warm, dry and intact.  Psychiatric:  Agitated, aggressive    ED Course  Procedures (including critical care time)  Patient did not respond to instructions to lie down and remain calm. He required chemical restraint.  Medications  ziprasidone (GEODON) injection 20 mg (20 mg Intramuscular Given 06/11/13 2215)  LORazepam (ATIVAN) injection 2 mg (2 mg Intramuscular Given 06/11/13 2213)  sterile water (preservative free) injection (  Given 06/11/13 2217)   Patient Vitals for the past 24 hrs:  BP Temp Temp src Pulse Resp SpO2  06/12/13 0015 130/66 mmHg - - 114 27 95 %  06/11/13 2300 106/67 mmHg - - 110 - 96 %  06/11/13 2245 121/65 mmHg - - 114 - 96 %  06/11/13 2230 128/78 mmHg - - 103 - 94 %  06/11/13 2218 - - - 108 20 97 %  06/11/13 2208 135/59 mmHg 96.7 F (35.9 C) Axillary 119 22 96 %  06/11/13 2132 129/93 mmHg - - - - -     12:09 AM Reevaluation with update and discussion. After initial assessment and treatment, an updated evaluation reveals he is sleeping comfortably. Repeat vital signs are reassuring. Family members were here earlier, but has since left.   Blake Taylor     Labs Review Labs Reviewed  CBC WITH DIFFERENTIAL - Abnormal; Notable for the following:    WBC 10.9 (*)    Hemoglobin 17.9 (*)    MCHC 36.8 (*)    All other components within normal limits  BASIC METABOLIC PANEL - Abnormal; Notable for the following:    Glucose, Bld 114 (*)  All other components within normal limits  URINALYSIS, ROUTINE W REFLEX MICROSCOPIC - Abnormal; Notable for the following:    Specific Gravity, Urine 1.003 (*)    All other components within normal limits  ETHANOL - Abnormal; Notable for the following:    Alcohol, Ethyl (B) 310 (*)    All other components within normal limits  URINE RAPID DRUG SCREEN (HOSP PERFORMED)   Imaging Review No results found.  EKG Interpretation     Ventricular Rate:  108 PR Interval:  149 QRS Duration: 91 QT  Interval:  342 QTC Calculation: 458 R Axis:   72 Text Interpretation:  Sinus tachycardia RSR' in V1 or V2, probably normal variant No previous ECGs available            MDM   1. Alcohol intoxication      Alcohol intoxication with ataxia and agitated behavior. Patient will need to be observed after sober to be evaluated for additional complaints or problems.  Nursing Notes Reviewed/ Care Coordinated, and agree without changes. Applicable Imaging Reviewed.  Interpretation of Laboratory Data incorporated into ED treatment   Plan: Care to oncoming provider team.     Flint Melter, MD 06/12/13 [phone]

## 2013-06-13 ENCOUNTER — Encounter: Payer: Self-pay | Admitting: Family Medicine

## 2013-08-08 ENCOUNTER — Encounter: Payer: BC Managed Care – PPO | Admitting: Family Medicine

## 2013-11-17 ENCOUNTER — Encounter: Payer: Self-pay | Admitting: Family Medicine

## 2013-11-17 ENCOUNTER — Ambulatory Visit (INDEPENDENT_AMBULATORY_CARE_PROVIDER_SITE_OTHER): Payer: BC Managed Care – PPO | Admitting: Family Medicine

## 2013-11-17 VITALS — BP 128/85 | HR 62 | Temp 98.5°F | Resp 18 | Ht 66.0 in | Wt 183.0 lb

## 2013-11-17 DIAGNOSIS — J019 Acute sinusitis, unspecified: Secondary | ICD-10-CM

## 2013-11-17 DIAGNOSIS — F17200 Nicotine dependence, unspecified, uncomplicated: Secondary | ICD-10-CM

## 2013-11-17 DIAGNOSIS — F17210 Nicotine dependence, cigarettes, uncomplicated: Secondary | ICD-10-CM | POA: Insufficient documentation

## 2013-11-17 MED ORDER — AMOXICILLIN 875 MG PO TABS: 875.0000 mg | ORAL_TABLET | Freq: Two times a day (BID) | ORAL | Status: DC

## 2013-11-17 NOTE — Progress Notes (Signed)
Pre visit review using our clinic review tool, if applicable. No additional management support is needed unless otherwise documented below in the visit note. 

## 2013-11-17 NOTE — Progress Notes (Signed)
OFFICE NOTE  11/17/2013  CC:  Chief Complaint  Patient presents with  . Nasal Congestion    x 2 months    HPI: Patient is a 48 y.o. Caucasian male who is here for nasal congestion, bad odor from nose when he blows it.  No PND or fever or cough.  Last couple of days, ST mainly on left side. No HA.  No sneezing, no eyes itchy or runny. He is a current smoker: 1 ppd of Kool menthols, wants to try quitting with nicotine replacement therapy.  Pertinent PMH:  Past Medical History  Diagnosis Date  . GERD (gastroesophageal reflux disease)     prilosec helpful  . Adenomatous colon polyp   . Family history of colon cancer   . Depression     trial of zoloft in the past--didn't help and caused stomach ache.  Wellbutrin 2009 --failed 6 wk trial.  . Helicobacter pylori antibody positive 08/2004  . Alcohol abuse     ED 06/11/2013   PSH and SOC hx reviewed today.  MEDS:  Outpatient Prescriptions Prior to Visit  Medication Sig Dispense Refill  . clonazePAM (KLONOPIN) 1 MG tablet 1 tab po prior to sleep for restless legs syndrome  30 tablet  5  . omeprazole (PRILOSEC) 10 MG capsule Take 1 capsule (10 mg total) by mouth daily.  90 capsule  3  . citalopram (CELEXA) 40 MG tablet Take 1 tablet (40 mg total) by mouth daily.  90 tablet  1  . ibuprofen (ADVIL,MOTRIN) 200 MG tablet Take 400 mg by mouth every morning.       No facility-administered medications prior to visit.    PE: Blood pressure 128/85, pulse 62, temperature 98.5 F (36.9 C), temperature source Temporal, resp. rate 18, height 5\' 6"  (1.676 m), weight 183 lb (83.008 kg), SpO2 98.00%. VS: noted--normal. Gen: alert, NAD, NONTOXIC APPEARING. HEENT: eyes without injection, drainage, or swelling.  Ears: EACs clear, TMs with normal light reflex and landmarks.  Nose: Clear rhinorrhea, with some dried, crusty exudate adherent to mildly injected mucosa.  No purulent d/c.  No paranasal sinus TTP.  No facial swelling.  Throat and mouth without  focal lesion.  No pharyngial swelling, erythema, or exudate.   Neck: supple, no LAD.   LUNGS: CTA bilat, nonlabored resps.   CV: RRR, no m/r/g. EXT: no c/c/e SKIN: no rash    IMPRESSION AND PLAN:  1) Acute sinusitis: amoxil 875mg  bid x 10d.  Saline nasal spray.  2) Tob dependence: wants to quit. Rx for nicotine patch 14 mcg, 1 per day x 52mo. Nicotine lozenge prn craving.  An After Visit Summary was printed and given to the patient.  FOLLOW UP: 1 mo smoking cessation f/u

## 2013-11-18 ENCOUNTER — Telehealth: Payer: Self-pay | Admitting: Family Medicine

## 2013-11-18 NOTE — Telephone Encounter (Signed)
Relevant patient education mailed to patient.  

## 2013-12-10 ENCOUNTER — Other Ambulatory Visit: Payer: Self-pay | Admitting: Family Medicine

## 2013-12-18 ENCOUNTER — Ambulatory Visit: Payer: BC Managed Care – PPO | Admitting: Family Medicine

## 2014-01-21 ENCOUNTER — Other Ambulatory Visit: Payer: Self-pay | Admitting: Family Medicine

## 2015-02-01 ENCOUNTER — Encounter: Payer: Self-pay | Admitting: Gastroenterology

## 2017-08-30 ENCOUNTER — Encounter: Payer: Self-pay | Admitting: Gastroenterology

## 2017-09-12 ENCOUNTER — Encounter: Payer: Self-pay | Admitting: Gastroenterology

## 2018-01-22 ENCOUNTER — Encounter: Payer: Self-pay | Admitting: Gastroenterology

## 2018-03-22 ENCOUNTER — Encounter: Payer: Self-pay | Admitting: Gastroenterology

## 2018-03-22 ENCOUNTER — Ambulatory Visit (AMBULATORY_SURGERY_CENTER): Payer: Self-pay

## 2018-03-22 VITALS — Ht 66.0 in | Wt 177.4 lb

## 2018-03-22 DIAGNOSIS — Z800 Family history of malignant neoplasm of digestive organs: Secondary | ICD-10-CM

## 2018-03-22 DIAGNOSIS — Z86010 Personal history of colonic polyps: Secondary | ICD-10-CM

## 2018-03-22 MED ORDER — PLENVU 140 G PO SOLR: 1.0000 | ORAL | 0 refills | Status: DC

## 2018-03-22 NOTE — Progress Notes (Signed)
No egg or soy allergy known to patient  No issues with past sedation with any surgeries  or procedures, no intubation problems  No diet pills per patient No home 02 use per patient  No blood thinners per patient  Pt denies issues with constipation  No A fib or A flutter  EMMI video sent to pt's e mail. Pt denies  

## 2018-04-01 ENCOUNTER — Ambulatory Visit (AMBULATORY_SURGERY_CENTER): Payer: BLUE CROSS/BLUE SHIELD | Admitting: Gastroenterology

## 2018-04-01 ENCOUNTER — Encounter: Payer: Self-pay | Admitting: Gastroenterology

## 2018-04-01 VITALS — BP 96/62 | HR 57 | Temp 97.1°F | Resp 13 | Ht 66.0 in | Wt 180.0 lb

## 2018-04-01 DIAGNOSIS — D122 Benign neoplasm of ascending colon: Secondary | ICD-10-CM

## 2018-04-01 DIAGNOSIS — K635 Polyp of colon: Secondary | ICD-10-CM

## 2018-04-01 DIAGNOSIS — Z1211 Encounter for screening for malignant neoplasm of colon: Secondary | ICD-10-CM | POA: Diagnosis not present

## 2018-04-01 DIAGNOSIS — D123 Benign neoplasm of transverse colon: Secondary | ICD-10-CM

## 2018-04-01 DIAGNOSIS — Z800 Family history of malignant neoplasm of digestive organs: Secondary | ICD-10-CM | POA: Diagnosis not present

## 2018-04-01 MED ORDER — SODIUM CHLORIDE 0.9 % IV SOLN: 500.0000 mL | Freq: Once | INTRAVENOUS | Status: AC

## 2018-04-01 NOTE — Op Note (Addendum)
[place] Patient Name: Blake Taylor Procedure Date: 04/01/2018 7:58 AM MRN: [id number] Endoscopist: Mallie Mussel L. Loletha Carrow , MD Age: 52 Referring MD:  Date of Birth: 01-22-1966 Gender: Male Account #: [id number] Procedure:                Colonoscopy Indications:              Colon cancer screening in patient at increased                            risk: Colorectal cancer in mother, Colon cancer                            screening in patient at increased risk: Colorectal                            cancer in brother Medicines:                Monitored Anesthesia Care Procedure:                Pre-Anesthesia Assessment:                           - Prior to the procedure, a History and Physical                            was performed, and patient medications and                            allergies were reviewed. The patient's tolerance of                            previous anesthesia was also reviewed. The risks                            and benefits of the procedure and the sedation                            options and risks were discussed with the patient.                            All questions were answered, and informed consent                            was obtained. Prior Anticoagulants: The patient has                            taken no previous anticoagulant or antiplatelet                            agents. ASA Grade Assessment: II - A patient with                            mild systemic disease. After reviewing the risks  and benefits, the patient was deemed in                            satisfactory condition to undergo the procedure.                           After obtaining informed consent, the colonoscope                            was passed under direct vision. Throughout the                            procedure, the patient's blood pressure, pulse, and                            oxygen saturations were monitored continuously.  The                            Colonoscope was introduced through the anus and                            advanced to the the cecum, identified by                            appendiceal orifice and ileocecal valve. The                            colonoscopy was performed without difficulty. The                            patient tolerated the procedure well. The quality                            of the bowel preparation was excellent. The                            ileocecal valve, appendiceal orifice, and rectum                            were photographed. The quality of the bowel                            preparation was evaluated using the BBPS [facility]                            Bowel Preparation Scale) with scores of: Right                            Colon = 3, Transverse Colon = 3 and Left Colon = 3                            (entire mucosa seen well with no residual staining,  small fragments of stool or opaque liquid). The                            total BBPS score equals 9. Scope In: 8:06:04 AM Scope Out: 8:28:08 AM Scope Withdrawal Time: 0 hours 17 minutes 54 seconds  Total Procedure Duration: 0 hours 22 minutes 4 seconds  Findings:                 The perianal and digital rectal examinations were                            normal.                           Two sessile polyps were found in the ascending                            colon. The polyps were 1 to 3 mm in size. These                            polyps were removed with a cold biopsy forceps.                            Resection and retrieval were complete.                           A 10 mm polyp was found in the ascending colon. The                            polyp was sessile. The polyp was removed with a hot                            snare. Resection and retrieval were complete.                           A 4 mm polyp was found in the splenic flexure. The                            polyp  was sessile. The polyp was removed with a                            cold snare. Resection and retrieval were complete.                           The exam was otherwise without abnormality on                            direct and retroflexion views. Complications:            No immediate complications. Estimated Blood Loss:     Estimated blood loss: none. Estimated blood loss                            was minimal. Impression:               -  Two 1 to 3 mm polyps in the ascending colon,                            removed with a cold biopsy forceps. Resected and                            retrieved.                           - One 10 mm polyp in the ascending colon, removed                            with a hot snare. Resected and retrieved.                           - One 4 mm polyp at the splenic flexure, removed                            with a cold snare. Resected and retrieved.                           - The examination was otherwise normal on direct                            and retroflexion views. Recommendation:           - Patient has a contact number available for                            emergencies. The signs and symptoms of potential                            delayed complications were discussed with the                            patient. Return to normal activities tomorrow.                            Written discharge instructions were provided to the                            patient.                           - Resume previous diet.                           - Continue present medications.                           - Await pathology results.                           - Repeat colonoscopy is recommended for  surveillance. The colonoscopy date will be                            determined after pathology results from today's                            exam become available for review. [name] L. Loletha Carrow, MD 04/01/2018 8:35:06 AM This report  has been signed electronically.

## 2018-04-01 NOTE — Patient Instructions (Signed)
YOU HAD AN ENDOSCOPIC PROCEDURE TODAY AT THE [facility]:   Refer to the procedure report that was given to you for any specific questions about what was found during the examination.  If the procedure report does not answer your questions, please call your gastroenterologist to clarify.  If you requested that your care partner not be given the details of your procedure findings, then the procedure report has been included in a sealed envelope for you to review at your convenience later.  YOU SHOULD EXPECT: Some feelings of bloating in the abdomen. Passage of more gas than usual.  Walking can help get rid of the air that was put into your GI tract during the procedure and reduce the bloating. If you had a lower endoscopy (such as a colonoscopy or flexible sigmoidoscopy) you may notice spotting of blood in your stool or on the toilet paper. If you underwent a bowel prep for your procedure, you may not have a normal bowel movement for a few days.  Please Note:  You might notice some irritation and congestion in your nose or some drainage.  This is from the oxygen used during your procedure.  There is no need for concern and it should clear up in a day or so.  SYMPTOMS TO REPORT IMMEDIATELY:   Following lower endoscopy (colonoscopy or flexible sigmoidoscopy):  Excessive amounts of blood in the stool  Significant tenderness or worsening of abdominal pains  Swelling of the abdomen that is new, acute  Fever of 100F or higher  For urgent or emergent issues, a gastroenterologist can be reached at any hour by calling [phone].   DIET:  We do recommend a small meal at first, but then you may proceed to your regular diet.  Drink plenty of fluids but you should avoid alcoholic beverages for 24 hours.  ACTIVITY:  You should plan to take it easy for the rest of today and you should NOT DRIVE or use heavy machinery until tomorrow (because of the sedation medicines used during the test).     FOLLOW UP: Our staff will call the number listed on your records the next business day following your procedure to check on you and address any questions or concerns that you may have regarding the information given to you following your procedure. If we do not reach you, we will leave a message.  However, if you are feeling well and you are not experiencing any problems, there is no need to return our call.  We will assume that you have returned to your regular daily activities without incident.  If any biopsies were taken you will be contacted by phone or by letter within the next 1-3 weeks.  Please call us at [phone] if you have not heard about the biopsies in 3 weeks.   Await for biopsy results Polyps (handout given)    SIGNATURES/CONFIDENTIALITY: You and/or your care partner have signed paperwork which will be entered into your electronic medical record.  These signatures attest to the fact that that the information above on your After Visit Summary has been reviewed and is understood.  Full responsibility of the confidentiality of this discharge information lies with you and/or your care-partner. 

## 2018-04-01 NOTE — Progress Notes (Signed)
A and O x3. Report to RN. Tolerated MAC anesthesia well.

## 2018-04-01 NOTE — Progress Notes (Signed)
Called to room to assist during endoscopic procedure.  Patient ID and intended procedure confirmed with present staff. Received instructions for my participation in the procedure from the performing physician.  

## 2018-04-02 ENCOUNTER — Telehealth: Payer: Self-pay | Admitting: *Deleted

## 2018-04-02 ENCOUNTER — Telehealth: Payer: Self-pay

## 2018-04-02 NOTE — Telephone Encounter (Signed)
  Follow up Call-   No flowsheet data found.   Patient questions:  Do you have a fever, pain , or abdominal swelling? No. Pain Score  0 *  Have you tolerated food without any problems? Yes.    Have you been able to return to your normal activities? Yes.    Do you have any questions about your discharge instructions: Diet   No. Medications  No. Follow up visit  No.  Do you have questions or concerns about your Care? Yes.  pt noticed blood on tp yesterday and today. Had a 10 cm polyp plus others removed.Advised to call Dr. Loletha Carrow if more than a tablespoon in toilet.   Actions: * If pain score is 4 or above: No action needed, pain <4.

## 2018-04-02 NOTE — Telephone Encounter (Signed)
  Follow up Call-  No flowsheet data found.   Patient questions:    Left message on f/u call

## 2018-04-04 ENCOUNTER — Encounter: Payer: Self-pay | Admitting: Gastroenterology

## 2019-02-26 DIAGNOSIS — N139 Obstructive and reflux uropathy, unspecified: Secondary | ICD-10-CM | POA: Insufficient documentation

## 2019-02-26 DIAGNOSIS — R739 Hyperglycemia, unspecified: Secondary | ICD-10-CM | POA: Insufficient documentation

## 2019-09-01 DIAGNOSIS — R7303 Prediabetes: Secondary | ICD-10-CM | POA: Diagnosis not present

## 2019-09-01 DIAGNOSIS — E782 Mixed hyperlipidemia: Secondary | ICD-10-CM | POA: Diagnosis not present

## 2019-09-01 DIAGNOSIS — F17210 Nicotine dependence, cigarettes, uncomplicated: Secondary | ICD-10-CM | POA: Diagnosis not present

## 2019-09-01 DIAGNOSIS — K219 Gastro-esophageal reflux disease without esophagitis: Secondary | ICD-10-CM | POA: Diagnosis not present

## 2019-09-01 DIAGNOSIS — R739 Hyperglycemia, unspecified: Secondary | ICD-10-CM | POA: Diagnosis not present

## 2020-01-02 DIAGNOSIS — H6121 Impacted cerumen, right ear: Secondary | ICD-10-CM | POA: Diagnosis not present

## 2020-01-02 DIAGNOSIS — J310 Chronic rhinitis: Secondary | ICD-10-CM | POA: Diagnosis not present

## 2020-01-02 DIAGNOSIS — H6981 Other specified disorders of Eustachian tube, right ear: Secondary | ICD-10-CM | POA: Diagnosis not present

## 2020-02-02 DIAGNOSIS — H0102A Squamous blepharitis right eye, upper and lower eyelids: Secondary | ICD-10-CM | POA: Diagnosis not present

## 2020-02-02 DIAGNOSIS — H0102B Squamous blepharitis left eye, upper and lower eyelids: Secondary | ICD-10-CM | POA: Diagnosis not present

## 2021-05-04 ENCOUNTER — Encounter: Payer: Self-pay | Admitting: Gastroenterology

## 2021-05-13 ENCOUNTER — Encounter: Payer: Self-pay | Admitting: Gastroenterology

## 2021-06-08 ENCOUNTER — Ambulatory Visit (AMBULATORY_SURGERY_CENTER): Payer: Self-pay

## 2021-06-08 ENCOUNTER — Other Ambulatory Visit: Payer: Self-pay

## 2021-06-08 ENCOUNTER — Encounter: Payer: Self-pay | Admitting: Gastroenterology

## 2021-06-08 VITALS — Ht 66.0 in | Wt 180.0 lb

## 2021-06-08 DIAGNOSIS — Z800 Family history of malignant neoplasm of digestive organs: Secondary | ICD-10-CM

## 2021-06-08 MED ORDER — PLENVU 140 G PO SOLR: 1.0000 | Freq: Once | ORAL | 0 refills | Status: AC

## 2021-06-08 NOTE — Progress Notes (Signed)
Denies allergies to eggs or soy products. Denies complication of anesthesia or sedation. Denies use of weight loss medication. Denies use of O2.   Emmi instructions given for colonoscopy.  

## 2021-06-22 ENCOUNTER — Other Ambulatory Visit: Payer: Self-pay

## 2021-06-22 ENCOUNTER — Ambulatory Visit (AMBULATORY_SURGERY_CENTER): Payer: BC Managed Care – PPO | Admitting: Gastroenterology

## 2021-06-22 ENCOUNTER — Encounter: Payer: Self-pay | Admitting: Gastroenterology

## 2021-06-22 VITALS — BP 113/77 | HR 66 | Temp 98.0°F | Resp 20 | Ht 66.0 in | Wt 180.0 lb

## 2021-06-22 DIAGNOSIS — Z86010 Personal history of colonic polyps: Secondary | ICD-10-CM | POA: Diagnosis not present

## 2021-06-22 DIAGNOSIS — Z800 Family history of malignant neoplasm of digestive organs: Secondary | ICD-10-CM

## 2021-06-22 DIAGNOSIS — Z1211 Encounter for screening for malignant neoplasm of colon: Secondary | ICD-10-CM | POA: Diagnosis not present

## 2021-06-22 DIAGNOSIS — D123 Benign neoplasm of transverse colon: Secondary | ICD-10-CM

## 2021-06-22 MED ORDER — SODIUM CHLORIDE 0.9 % IV SOLN: 500.0000 mL | Freq: Once | INTRAVENOUS | Status: AC

## 2021-06-22 NOTE — Op Note (Signed)
[place] Patient Name: Blake Taylor Procedure Date: 06/22/2021 9:24 AM MRN: [id number] Endoscopist: Mallie Mussel L. Loletha Carrow , MD Age: 55 Referring MD:  Date of Birth: 10/02/1965 Gender: Male Account #: [id number] Procedure:                Colonoscopy Indications:              Surveillance: Personal history of adenomatous                            polyps on last colonoscopy 3 years ago                           TA x 3 and SSP > 18mm in 03/2018 (exam done for Fam                            Hx CRC mother and brother)                           hyperplastic polyps in 2014 Medicines:                Monitored Anesthesia Care Procedure:                Pre-Anesthesia Assessment:                           - Prior to the procedure, a History and Physical                            was performed, and patient medications and                            allergies were reviewed. The patient's tolerance of                            previous anesthesia was also reviewed. The risks                            and benefits of the procedure and the sedation                            options and risks were discussed with the patient.                            All questions were answered, and informed consent                            was obtained. Prior Anticoagulants: The patient has                            taken no previous anticoagulant or antiplatelet                            agents. ASA Grade Assessment: II - A patient with  mild systemic disease. After reviewing the risks                            and benefits, the patient was deemed in                            satisfactory condition to undergo the procedure.                           After obtaining informed consent, the colonoscope                            was passed under direct vision. Throughout the                            procedure, the patient's blood pressure, pulse, and                             oxygen saturations were monitored continuously. The                            #[id number] Olympus [id number] was introduced through                            the anus and advanced to the the terminal ileum,                            with identification of the appendiceal orifice and                            IC valve. The colonoscopy was performed without                            difficulty. The patient tolerated the procedure                            well. The quality of the bowel preparation was                            excellent. The terminal ileum, ileocecal valve,                            appendiceal orifice, and rectum were photographed.                            The bowel preparation used was Plenvu. Scope In: 9:32:53 AM Scope Out: 9:52:33 AM Scope Withdrawal Time: 0 hours 15 minutes 12 seconds  Total Procedure Duration: 0 hours 19 minutes 40 seconds  Findings:                 The perianal and digital rectal examinations were                            normal.  The terminal ileum appeared normal.                           Three semi-sessile polyps were found in the                            transverse colon. The polyps were 4 to 6 mm in                            size. These polyps were removed with a cold snare.                            Resection and retrieval were complete.                           A few small-mouthed diverticula were found in the                            left colon.(mild associated tortuosity)                           The exam was otherwise without abnormality on                            direct and retroflexion views. Complications:            No immediate complications. Estimated Blood Loss:     Estimated blood loss was minimal. Impression:               - The examined portion of the ileum was normal.                           - Three 4 to 6 mm polyps in the transverse colon,                            removed  with a cold snare. Resected and retrieved.                           - Diverticulosis in the left colon.                           - The examination was otherwise normal on direct                            and retroflexion views. Recommendation:           - Patient has a contact number available for                            emergencies. The signs and symptoms of potential                            delayed complications were discussed with the  patient. Return to normal activities tomorrow.                            Written discharge instructions were provided to the                            patient.                           - Resume previous diet.                           - Continue present medications.                           - Await pathology results.                           - Repeat colonoscopy is recommended for                            surveillance. The colonoscopy date will be                            determined after pathology results from today's                            exam become available for review. [name] L. Loletha Carrow, MD 06/22/2021 9:58:57 AM This report has been signed electronically.

## 2021-06-22 NOTE — Progress Notes (Signed)
History and Physical:  This patient presents for endoscopic testing for: Encounter Diagnosis  Name Primary?   Personal history of colonic polyps Yes    TA and SSP Aug 2019 (exam done for Fam Hx CRC) Patient denies chronic abdominal pain, rectal bleeding, constipation or diarrhea.   ROS: Patient denies chest pain or cough   Past Medical History: Past Medical History:  Diagnosis Date   Adenomatous colon polyp    Alcohol abuse    ED 06/11/2013   Depression    trial of zoloft in the past--didn't help and caused stomach ache.  Wellbutrin 2009 --failed 6 wk trial.   Family history of colon cancer    GERD (gastroesophageal reflux disease)    prilosec helpful   Helicobacter pylori antibody positive 08/2004     Past Surgical History: Past Surgical History:  Procedure Laterality Date   APPENDECTOMY  1990   COLONOSCOPY     COLONOSCOPY W/ POLYPECTOMY  03/09/2009   Adenomatous polyps on 2 endoscopies in the past, most recently 03/2009.  Repeat recommended 2013 (Dr. Sharlett Iles)   POLYPECTOMY     VASECTOMY  1991    Allergies: No Known Allergies  Outpatient Meds: Current Outpatient Medications  Medication Sig Dispense Refill   omeprazole (PRILOSEC) 10 MG capsule Take 10 mg by mouth daily.     ibuprofen (ADVIL,MOTRIN) 200 MG tablet Take 400 mg by mouth every morning.     Current Facility-Administered Medications  Medication Dose Route Frequency Provider Last Rate Last Admin   0.9 %  sodium chloride infusion  500 mL Intravenous Once Danis, Estill Cotta III, MD       0.9 %  sodium chloride infusion  500 mL Intravenous Once Nelida Meuse III, MD          ___________________________________________________________________ Objective   Exam:  BP 104/72   Pulse 91   Temp 98 F (36.7 C) (Temporal)   Ht 5\' 6"  (1.676 m)   Wt 180 lb (81.6 kg)   SpO2 97%   BMI 29.05 kg/m   CV: RRR without murmur, S1/S2 Resp: clear to auscultation bilaterally, normal RR and effort noted GI: soft,  no tenderness, with active bowel sounds.   Assessment: Encounter Diagnosis  Name Primary?   Personal history of colonic polyps Yes     Plan: Colonoscopy  The benefits and risks of the planned procedure were described in detail with the patient or (when appropriate) their health care proxy.  Risks were outlined as including, but not limited to, bleeding, infection, perforation, adverse medication reaction leading to cardiac or pulmonary decompensation, pancreatitis (if ERCP).  The limitation of incomplete mucosal visualization was also discussed.  No guarantees or warranties were given.    The patient is appropriate for an endoscopic procedure in the ambulatory setting.   - Wilfrid Lund, MD

## 2021-06-22 NOTE — Progress Notes (Signed)
Called to room to assist during endoscopic procedure.  Patient ID and intended procedure confirmed with present staff. Received instructions for my participation in the procedure from the performing physician.  

## 2021-06-22 NOTE — Patient Instructions (Signed)
YOU HAD AN ENDOSCOPIC PROCEDURE TODAY AT THE [facility]:   Refer to the procedure report that was given to you for any specific questions about what was found during the examination.  If the procedure report does not answer your questions, please call your gastroenterologist to clarify.  If you requested that your care partner not be given the details of your procedure findings, then the procedure report has been included in a sealed envelope for you to review at your convenience later.  YOU SHOULD EXPECT: Some feelings of bloating in the abdomen. Passage of more gas than usual.  Walking can help get rid of the air that was put into your GI tract during the procedure and reduce the bloating. If you had a lower endoscopy (such as a colonoscopy or flexible sigmoidoscopy) you may notice spotting of blood in your stool or on the toilet paper. If you underwent a bowel prep for your procedure, you may not have a normal bowel movement for a few days.  Please Note:  You might notice some irritation and congestion in your nose or some drainage.  This is from the oxygen used during your procedure.  There is no need for concern and it should clear up in a day or so.  SYMPTOMS TO REPORT IMMEDIATELY:  Following lower endoscopy (colonoscopy or flexible sigmoidoscopy):  Excessive amounts of blood in the stool  Significant tenderness or worsening of abdominal pains  Swelling of the abdomen that is new, acute  Fever of 100F or higher   For urgent or emergent issues, a gastroenterologist can be reached at any hour by calling [phone]. Do not use MyChart messaging for urgent concerns.    DIET:  We do recommend a small meal at first, but then you may proceed to your regular diet.  Drink plenty of fluids but you should avoid alcoholic beverages for 24 hours.  MEDICATIONS:  Continue present medications.  Please see handouts given to you by your recovery nurse.  Thank you for allowing us to  provide for your healthcare needs today.  ACTIVITY:  You should plan to take it easy for the rest of today and you should NOT DRIVE or use heavy machinery until tomorrow (because of the sedation medicines used during the test).    FOLLOW UP: Our staff will call the number listed on your records 48-72 hours following your procedure to check on you and address any questions or concerns that you may have regarding the information given to you following your procedure. If we do not reach you, we will leave a message.  We will attempt to reach you two times.  During this call, we will ask if you have developed any symptoms of COVID 19. If you develop any symptoms (ie: fever, flu-like symptoms, shortness of breath, cough etc.) before then, please call [phone].  If you test positive for Covid 19 in the 2 weeks post procedure, please call and report this information to us.    If any biopsies were taken you will be contacted by phone or by letter within the next 1-3 weeks.  Please call us at [phone] if you have not heard about the biopsies in 3 weeks.    SIGNATURES/CONFIDENTIALITY: You and/or your care partner have signed paperwork which will be entered into your electronic medical record.  These signatures attest to the fact that that the information above on your After Visit Summary has been reviewed and is understood.  Full responsibility of the   confidentiality of this discharge information lies with you and/or your care-partner.  

## 2021-06-22 NOTE — Progress Notes (Signed)
Pt's states no medical or surgical changes since previsit or office visit. 

## 2021-06-22 NOTE — Progress Notes (Signed)
Pt Drowsy. VSS. To PACU, report to RN. No anesthetic complications noted.  

## 2021-06-24 ENCOUNTER — Encounter: Payer: Self-pay | Admitting: Gastroenterology

## 2021-06-24 ENCOUNTER — Telehealth: Payer: Self-pay | Admitting: *Deleted

## 2021-06-24 NOTE — Telephone Encounter (Signed)
  Follow up Call-  Call back number 06/22/2021  Post procedure Call Back phone  # [phone]  Permission to leave phone message Yes  Some recent data might be hidden     Patient questions:  Do you have a fever, pain , or abdominal swelling? No. Pain Score  0 *  Have you tolerated food without any problems? Yes.    Have you been able to return to your normal activities? Yes.    Do you have any questions about your discharge instructions: Diet   No. Medications  No. Follow up visit  No.  Do you have questions or concerns about your Care? No.  Actions: * If pain score is 4 or above: No action needed, pain <4.  Have you developed a fever since your procedure? no  2.   Have you had an respiratory symptoms (SOB or cough) since your procedure?  no  3.   Have you tested positive for COVID 19 since your procedure no  4.   Have you had any family members/close contacts diagnosed with the COVID 19 since your procedure?  no   If yes to any of these questions please route to Joylene John, RN and Joella Prince, RN

## 2021-06-27 ENCOUNTER — Encounter: Payer: Self-pay | Admitting: Family Medicine

## 2021-06-27 ENCOUNTER — Ambulatory Visit: Payer: BC Managed Care – PPO | Admitting: Family Medicine

## 2021-06-27 ENCOUNTER — Other Ambulatory Visit: Payer: Self-pay

## 2021-06-27 ENCOUNTER — Ambulatory Visit (INDEPENDENT_AMBULATORY_CARE_PROVIDER_SITE_OTHER): Payer: BC Managed Care – PPO

## 2021-06-27 VITALS — BP 114/75 | HR 63 | Temp 97.8°F | Ht 66.0 in | Wt 181.0 lb

## 2021-06-27 DIAGNOSIS — R0789 Other chest pain: Secondary | ICD-10-CM

## 2021-06-27 DIAGNOSIS — Z13220 Encounter for screening for lipoid disorders: Secondary | ICD-10-CM

## 2021-06-27 DIAGNOSIS — E782 Mixed hyperlipidemia: Secondary | ICD-10-CM

## 2021-06-27 DIAGNOSIS — F17210 Nicotine dependence, cigarettes, uncomplicated: Secondary | ICD-10-CM

## 2021-06-27 DIAGNOSIS — R079 Chest pain, unspecified: Secondary | ICD-10-CM | POA: Diagnosis not present

## 2021-06-27 DIAGNOSIS — R350 Frequency of micturition: Secondary | ICD-10-CM

## 2021-06-27 DIAGNOSIS — Z23 Encounter for immunization: Secondary | ICD-10-CM

## 2021-06-27 DIAGNOSIS — F988 Other specified behavioral and emotional disorders with onset usually occurring in childhood and adolescence: Secondary | ICD-10-CM | POA: Insufficient documentation

## 2021-06-27 NOTE — Assessment & Plan Note (Signed)
Check urinalysis, PSA and glucose today.

## 2021-06-27 NOTE — Assessment & Plan Note (Signed)
Updating lipid panel. 

## 2021-06-27 NOTE — Assessment & Plan Note (Signed)
Chest pain seems noncardiac based on history and symptoms.  I think this is likely more so related to his GERD.  Chest x-ray ordered.  History of smoking as well.  I will have him increase his Prilosec to twice a day for the next 2 weeks.  Updating labs today.

## 2021-06-27 NOTE — Progress Notes (Signed)
Blake Taylor - 55 y.o. male MRN [id number]  Date of birth: 1966/04/30  Subjective Chief Complaint  Patient presents with   Establish Care    HPI Blake Taylor is a 55 year old male here today for initial visit to establish care.  He has not had a primary care doctor in a couple years.  He does have a history of GERD, hyperlipidemia and nicotine dependence.  He has concern of intermittent chest pain.  Chest pain described as sharp chest pain often occurring on the right side of his chest.  He does have some epigastric pain at times as well.  He denies any nausea associated with this.  He has not had any chest pressure.  Pain does not worsen with exercise or activity.  He is taking omeprazole for management of reflux symptoms.  This has been helpful.  He is a current smoker and smokes about 1 pack/day.  He is also noted some urinary frequency and urgency.  He denies any pain with urination.  He has not noted any blood in his urine.  ROS:  A comprehensive ROS was completed and negative except as noted per HPI  No Known Allergies  Past Medical History:  Diagnosis Date   Adenomatous colon polyp    Alcohol abuse    ED 06/11/2013   Depression    trial of zoloft in the past--didn't help and caused stomach ache.  Wellbutrin 2009 --failed 6 wk trial.   Family history of colon cancer    GERD (gastroesophageal reflux disease)    prilosec helpful   Helicobacter pylori antibody positive 08/2004    Past Surgical History:  Procedure Laterality Date   APPENDECTOMY  1990   COLONOSCOPY     COLONOSCOPY W/ POLYPECTOMY  03/09/2009   Adenomatous polyps on 2 endoscopies in the past, most recently 03/2009.  Repeat recommended 2013 (Dr. Sharlett Iles)   POLYPECTOMY     VASECTOMY  1991    Social History   Socioeconomic History   Marital status: Married    Spouse name: Not on file   Number of children: Not on file   Years of education: Not on file   Highest education level: Not on file  Occupational History    Occupation: Pump Installer  Tobacco Use   Smoking status: Every Day    Packs/day: 1.00    Years: 30.00    Pack years: 30.00    Types: Cigarettes    Start date: 08/07/1990   Smokeless tobacco: Never  Vaping Use   Vaping Use: Never used  Substance and Sexual Activity   Alcohol use: Yes    Alcohol/week: 1.0 - 2.0 standard drink    Types: 1 - 2 Standard drinks or equivalent per week    Comment: occasionally   Drug use: No   Sexual activity: Yes    Partners: Female    Birth control/protection: Surgical  Other Topics Concern   Not on file  Social History Narrative   ** Merged History Encounter **       Married, 4 daughters live Radiation protection practitioner.   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   Tobacco 30 pack-yr hx.  Alcohol only occasionally now, hx of alcohol abuse.  No hx of drug abuse.   No exercising.  Typical american diet.   Hx of ED visit for acute alcohol intox: was combative, aggressive, had to be chemically restrained (06/11/13).   Social Determinants of Health   Financial Resource Strain: Not on file  Food  Insecurity: Not on file  Transportation Needs: Not on file  Physical Activity: Not on file  Stress: Not on file  Social Connections: Not on file    Family History  Problem Relation Age of Onset   Colon cancer Mother 48   Lung cancer Mother    Liver cancer Mother    Colon polyps Mother    Throat cancer Father    Esophageal cancer Father    Colon cancer Brother 55       early 31's   Colon polyps Brother    Rectal cancer Neg Hx    Stomach cancer Neg Hx     Health Maintenance  Topic Date Due   HIV Screening  Never done   Hepatitis C Screening  Never done   Zoster Vaccines- Shingrix (1 of 2) 09/27/2021 (Originally 07/25/2015)   INFLUENZA VACCINE  11/04/2021 (Originally 03/07/2021)   Pneumococcal Vaccine 57-31 Years old (1 - PCV) 06/27/2022 (Originally 07/25/1971)   COLONOSCOPY (Pts 45-22yrs Insurance coverage will  need to be confirmed)  06/22/2024   TETANUS/TDAP  06/28/2031   HPV VACCINES  Aged Out   COVID-19 Vaccine  Discontinued     ----------------------------------------------------------------------------------------------------------------------------------------------------------------------------------------------------------------- Physical Exam BP 114/75 (BP Location: Left Arm, Patient Position: Sitting, Cuff Size: Normal)   Pulse 63   Temp 97.8 F (36.6 C) (Oral)   Ht 5\' 6"  (1.676 m)   Wt 181 lb (82.1 kg)   SpO2 98%   BMI 29.21 kg/m   Physical Exam Constitutional:      Appearance: Normal appearance.  Eyes:     General: No scleral icterus. Cardiovascular:     Rate and Rhythm: Normal rate and regular rhythm.  Pulmonary:     Effort: Pulmonary effort is normal.     Breath sounds: Normal breath sounds.  Abdominal:     General: Abdomen is flat. There is no distension.     Palpations: Abdomen is soft.     Tenderness: There is no abdominal tenderness.  Musculoskeletal:     Cervical back: Neck supple.  Neurological:     General: No focal deficit present.     Mental Status: He is alert.  Psychiatric:        Mood and Affect: Mood normal.        Behavior: Behavior normal.    ------------------------------------------------------------------------------------------------------------------------------------------------------------------------------------------------------------------- Assessment and Plan  Urinary frequency Check urinalysis, PSA and glucose today.  Mixed hyperlipidemia Updating lipid panel.  Other chest pain Chest pain seems noncardiac based on history and symptoms.  I think this is likely more so related to his GERD.  Chest x-ray ordered.  History of smoking as well.  I will have him increase his Prilosec to twice a day for the next 2 weeks.  Updating labs today.   No orders of the defined types were placed in this encounter.   No follow-ups on  file.    This visit occurred during the SARS-CoV-2 public health emergency.  Safety protocols were in place, including screening questions prior to the visit, additional usage of staff PPE, and extensive cleaning of exam room while observing appropriate contact time as indicated for disinfecting solutions.

## 2021-06-27 NOTE — Patient Instructions (Signed)
Nice to meet you today! We'll be in touch with lab and xray results.  Try increasing prilosec to 2 tablets daily for the next 2 weeks.

## 2021-06-28 ENCOUNTER — Encounter: Payer: Self-pay | Admitting: Family Medicine

## 2021-06-28 LAB — COMPLETE METABOLIC PANEL WITH GFR
AG Ratio: 1.2 (calc) (ref 1.0–2.5)
ALT: 18 U/L (ref 9–46)
AST: 12 U/L (ref 10–35)
Albumin: 4.3 g/dL (ref 3.6–5.1)
Alkaline phosphatase (APISO): 50 U/L (ref 35–144)
BUN: 9 mg/dL (ref 7–25)
CO2: 30 mmol/L (ref 20–32)
Calcium: 9.6 mg/dL (ref 8.6–10.3)
Chloride: 104 mmol/L (ref 98–110)
Creat: 0.92 mg/dL (ref 0.70–1.30)
Globulin: 3.6 g/dL (calc) (ref 1.9–3.7)
Glucose, Bld: 93 mg/dL (ref 65–139)
Potassium: 4.5 mmol/L (ref 3.5–5.3)
Sodium: 139 mmol/L (ref 135–146)
Total Bilirubin: 1.1 mg/dL (ref 0.2–1.2)
Total Protein: 7.9 g/dL (ref 6.1–8.1)
eGFR: 98 mL/min/{1.73_m2} (ref >=60)

## 2021-06-28 LAB — CBC WITH DIFFERENTIAL/PLATELET
Absolute Monocytes: 401 cells/uL (ref 200–950)
Basophils Absolute: 98 cells/uL (ref 0–200)
Basophils Relative: 1.1 %
Eosinophils Absolute: 142 cells/uL (ref 15–500)
Eosinophils Relative: 1.6 %
HCT: 48.8 % (ref 38.5–50.0)
Hemoglobin: 16.9 g/dL (ref 13.2–17.1)
Lymphs Abs: 2643 cells/uL (ref 850–3900)
MCH: 31.7 pg (ref 27.0–33.0)
MCHC: 34.6 g/dL (ref 32.0–36.0)
MCV: 91.6 fL (ref 80.0–100.0)
MPV: 10.7 fL (ref 7.5–12.5)
Monocytes Relative: 4.5 %
Neutro Abs: 5616 cells/uL (ref 1500–7800)
Neutrophils Relative %: 63.1 %
Platelets: 284 10*3/uL (ref 140–400)
RBC: 5.33 10*6/uL (ref 4.20–5.80)
RDW: 12.6 % (ref 11.0–15.0)
Total Lymphocyte: 29.7 %
WBC: 8.9 10*3/uL (ref 3.8–10.8)

## 2021-06-28 LAB — URINALYSIS W MICROSCOPIC + REFLEX CULTURE
Bacteria, UA: NONE SEEN /HPF
Bilirubin Urine: NEGATIVE
Glucose, UA: NEGATIVE
Hgb urine dipstick: NEGATIVE
Hyaline Cast: NONE SEEN /LPF
Ketones, ur: NEGATIVE
Leukocyte Esterase: NEGATIVE
Nitrites, Initial: NEGATIVE
Protein, ur: NEGATIVE
RBC / HPF: NONE SEEN /HPF (ref 0–2)
Specific Gravity, Urine: 1.008 (ref 1.001–1.035)
Squamous Epithelial / HPF: NONE SEEN /HPF (ref <=5)
WBC, UA: NONE SEEN /HPF (ref 0–5)
pH: 7.5 (ref 5.0–8.0)

## 2021-06-28 LAB — LIPID PANEL W/REFLEX DIRECT LDL
Cholesterol: 176 mg/dL (ref <200)
HDL: 41 mg/dL (ref >=40)
LDL Cholesterol (Calc): 113 mg/dL (calc) — ABNORMAL HIGH
Non-HDL Cholesterol (Calc): 135 mg/dL (calc) — ABNORMAL HIGH (ref <130)
Total CHOL/HDL Ratio: 4.3 (calc) (ref <5.0)
Triglycerides: 110 mg/dL (ref <150)

## 2021-06-28 LAB — NO CULTURE INDICATED

## 2021-06-28 LAB — PSA: PSA: 0.22 ng/mL (ref <=4.00)

## 2021-07-07 DIAGNOSIS — K098 Other cysts of oral region, not elsewhere classified: Secondary | ICD-10-CM | POA: Diagnosis not present

## 2021-07-22 ENCOUNTER — Encounter: Payer: Self-pay | Admitting: Gastroenterology

## 2021-07-29 ENCOUNTER — Encounter: Payer: Self-pay | Admitting: Plastic Surgery

## 2021-07-29 ENCOUNTER — Ambulatory Visit: Payer: BC Managed Care – PPO | Admitting: Plastic Surgery

## 2021-07-29 ENCOUNTER — Other Ambulatory Visit: Payer: Self-pay

## 2021-07-29 VITALS — BP 130/84 | HR 61 | Ht 66.0 in | Wt 184.4 lb

## 2021-07-29 DIAGNOSIS — D489 Neoplasm of uncertain behavior, unspecified: Secondary | ICD-10-CM

## 2021-08-01 NOTE — Progress Notes (Signed)
Referring Provider Luetta Nutting, [address]  [address],  [place] [address]   CC:  Left temple mass.   Blake Taylor is an 55 y.o. male.  HPI: Patient is a 55 year old with a history of left temporal mass.  This been present for many years but is now gotten larger.  The patient is interested in possible removal.  This is never been imaged.  No Known Allergies  Outpatient Encounter Medications as of 07/29/2021  Medication Sig   omeprazole (PRILOSEC) 10 MG capsule Take 10 mg by mouth daily.   Facility-Administered Encounter Medications as of 07/29/2021  Medication   0.9 %  sodium chloride infusion   0.9 %  sodium chloride infusion     Past Medical History:  Diagnosis Date   Adenomatous colon polyp    Alcohol abuse    ED 06/11/2013   Depression    trial of zoloft in the past--didn't help and caused stomach ache.  Wellbutrin 2009 --failed 6 wk trial.   Family history of colon cancer    GERD (gastroesophageal reflux disease)    prilosec helpful   Helicobacter pylori antibody positive 08/2004    Past Surgical History:  Procedure Laterality Date   APPENDECTOMY  1990   COLONOSCOPY     COLONOSCOPY W/ POLYPECTOMY  03/09/2009   Adenomatous polyps on 2 endoscopies in the past, most recently 03/2009.  Repeat recommended 2013 (Dr. Sharlett Iles)   POLYPECTOMY     VASECTOMY  1991    Family History  Problem Relation Age of Onset   Colon cancer Mother 35   Lung cancer Mother    Liver cancer Mother    Colon polyps Mother    Throat cancer Father    Esophageal cancer Father    Colon cancer Brother 36       early 37's   Colon polyps Brother    Rectal cancer Neg Hx    Stomach cancer Neg Hx     Social History   Social History Narrative   ** Merged History Encounter **       Married, 4 daughters live Radiation protection practitioner.   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   Tobacco 30 pack-yr hx.  Alcohol only  occasionally now, hx of alcohol abuse.  No hx of drug abuse.   No exercising.  Typical american diet.   Hx of ED visit for acute alcohol intox: was combative, aggressive, had to be chemically restrained (06/11/13).     Review of Systems General: Denies fevers, chills, weight loss CV: Denies chest pain, shortness of breath, palpitations   Physical Exam Vitals with BMI 07/29/2021 06/27/2021 06/22/2021  Height 5\' 6"  5\' 6"  -  Weight 184 lbs 6 oz 181 lbs -  BMI 16.94 50.38 -  Systolic 882 800 349  Diastolic 84 75 77  Pulse 61 63 66  Some encounter information is confidential and restricted. Go to Review Flowsheets activity to see all data.    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  CT imaging indicated for next step.  Time based coding: 16 minutes were spent with the patient.  Greater than 50% was spent on counseling cordination of care.  We discussed reasons for imaging and smoking cessation was encouraged.   [name] 08/01/2021, 11:44  AM

## 2021-08-02 ENCOUNTER — Encounter: Payer: Self-pay | Admitting: Gastroenterology

## 2021-08-23 ENCOUNTER — Ambulatory Visit (HOSPITAL_BASED_OUTPATIENT_CLINIC_OR_DEPARTMENT_OTHER)
Admission: RE | Admit: 2021-08-23 | Discharge: 2021-08-23 | Disposition: A | Discharge status: Home / Self Care | Payer: BC Managed Care – PPO | Source: Ambulatory Visit | Attending: Plastic Surgery | Admitting: Plastic Surgery

## 2021-08-23 ENCOUNTER — Other Ambulatory Visit: Payer: Self-pay

## 2021-08-23 DIAGNOSIS — D489 Neoplasm of uncertain behavior, unspecified: Secondary | ICD-10-CM | POA: Diagnosis not present

## 2021-08-23 DIAGNOSIS — R220 Localized swelling, mass and lump, head: Secondary | ICD-10-CM | POA: Diagnosis not present

## 2021-08-23 LAB — POCT I-STAT CREATININE: Creatinine, Ser: 1 mg/dL (ref 0.61–1.24)

## 2021-08-23 MED ORDER — IOHEXOL 300 MG/ML  SOLN
75.0000 mL | Freq: Once | INTRAMUSCULAR | Status: AC | PRN
  Administered 2021-08-23: 75 mL via INTRAVENOUS

## 2021-08-24 ENCOUNTER — Institutional Professional Consult (permissible substitution): Payer: BC Managed Care – PPO | Admitting: Plastic Surgery

## 2021-10-07 ENCOUNTER — Other Ambulatory Visit: Payer: Self-pay

## 2021-10-07 ENCOUNTER — Ambulatory Visit (INDEPENDENT_AMBULATORY_CARE_PROVIDER_SITE_OTHER): Payer: BC Managed Care – PPO | Admitting: Plastic Surgery

## 2021-10-07 ENCOUNTER — Encounter: Payer: Self-pay | Admitting: Plastic Surgery

## 2021-10-07 DIAGNOSIS — D489 Neoplasm of uncertain behavior, unspecified: Secondary | ICD-10-CM

## 2021-10-07 NOTE — Progress Notes (Signed)
? ?  Referring Provider ?Blake Nutting, DO ?[address]  ?[address] ?[place],  [place] [address]  ? ?CC:  ?Left temple mass. ? ?Blake Taylor is an 56 y.o. male.  ?HPI: Patient is following up after CT scan to evaluate left temporal mass.  He is uncertain whether he would like to have this excised. ? ?Review of Systems ?General: No fever chills or unintentional weight loss ? ?Physical Exam ?Vitals with BMI 07/29/2021 06/27/2021 06/22/2021  ?Height 5\' 6"  5\' 6"  -  ?Weight 184 lbs 6 oz 181 lbs -  ?BMI 29.78 29.23 -  ?Systolic [phone]  ?Diastolic 84 75 77  ?Pulse 61 63 66  ?Some encounter information is confidential and restricted. Go to Review Flowsheets activity to see all data.  ?  ?General:  No acute distress,  Alert and oriented, Non-Toxic, Normal speech and affect ?HEENT: Still 3 x 3 cm left temple mass, nonmobile, firm ? ? ?CT scan: Review scan which shows 2.6 x 1.9 x 1.6 cm mass.  No intracranial connection.  Consistent with possible dermoid. ? ?Assessment/Plan ?Based on the scan I recommend excision.  The patient is unsure whether he wants to proceed.  I think this is likely benign lesion so if he would like to be followed we can recheck him.  He is going to think about whether he would like to proceed with surgery. ? ?Blake Taylor ?10/07/2021, 10:33 AM  ? ? ?    ?

## 2022-11-25 IMAGING — DX DG CHEST 2V
2 series · 2 of 2 positions shown · non-contrast
Comparison: None.

CLINICAL DATA: Chest pain, smoker

EXAM:
CHEST - 2 VIEW

[chest pa]
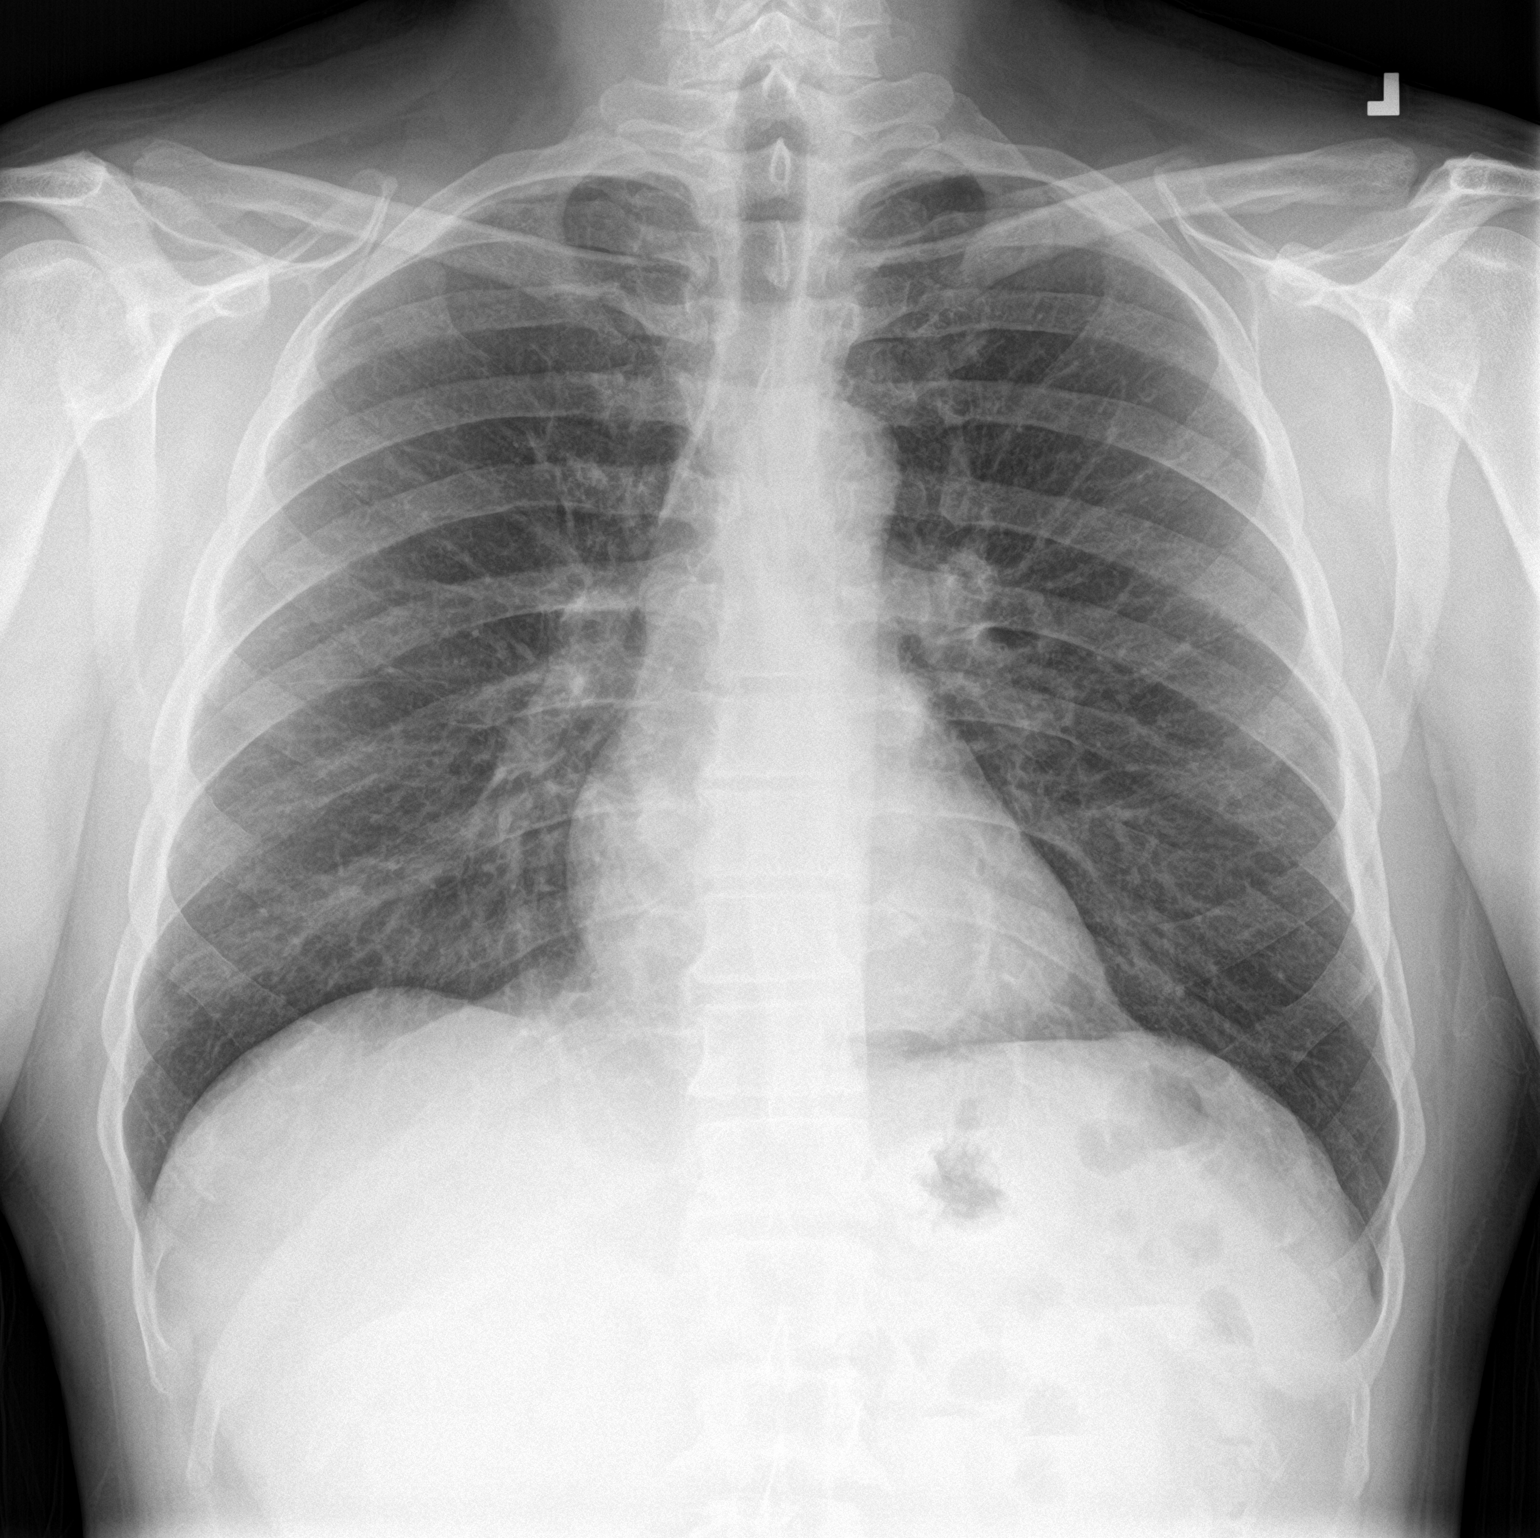

[chest lat]
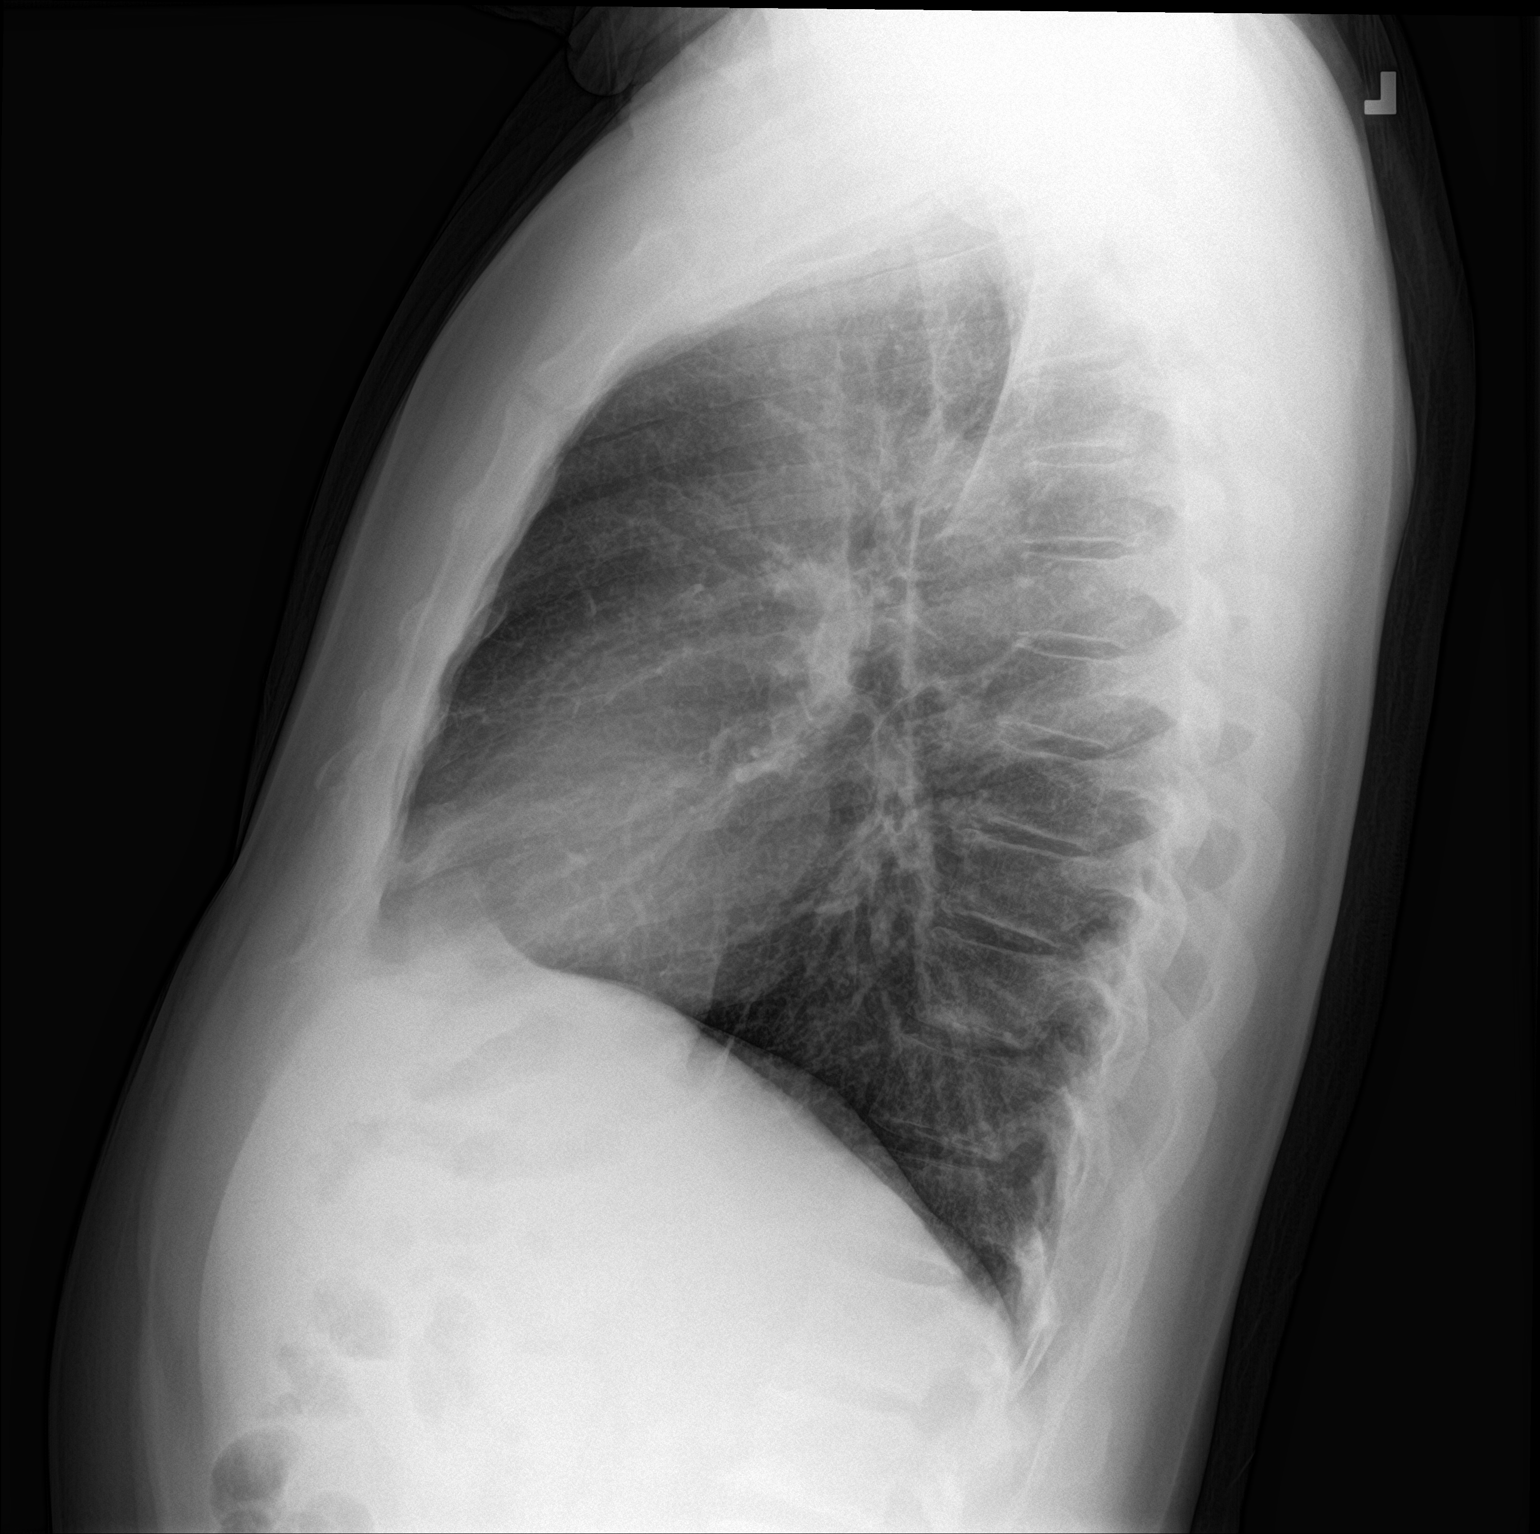

[2 of 2 positions shown; findings below may reference images not displayed]

FINDINGS: The heart size and mediastinal contours are within normal limits.
Both lungs are clear. The visualized skeletal structures are
unremarkable.
IMPRESSION: No active cardiopulmonary disease.

## 2023-01-21 IMAGING — CT CT MAXILLOFACIAL W/ CM
3 series · 15 of 47 positions shown, 18 images · IV contrast (APPLIED)
Comparison: None.

CLINICAL DATA: Left temporal region mass.

EXAM:
CT MAXILLOFACIAL WITH CONTRAST
TECHNIQUE: Multidetector CT imaging of the maxillofacial structures was
performed with intravenous contrast. Multiplanar CT image
reconstructions were also generated.

[Series 2: 1 max soft · axial · 0.36mm/px · z∈[+688,+840]mm · 9 of 90 slices shown, 12 images]
[im 7/90  brain]
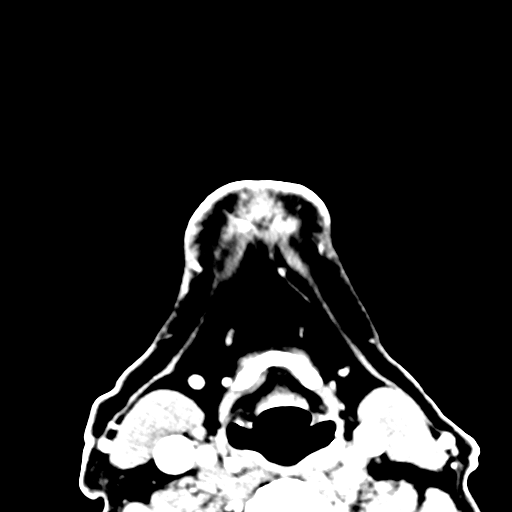
[im 7/90  bone]
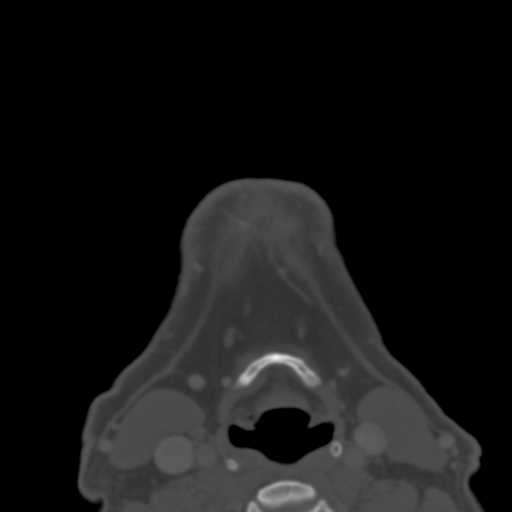
[im 16/90  bone]
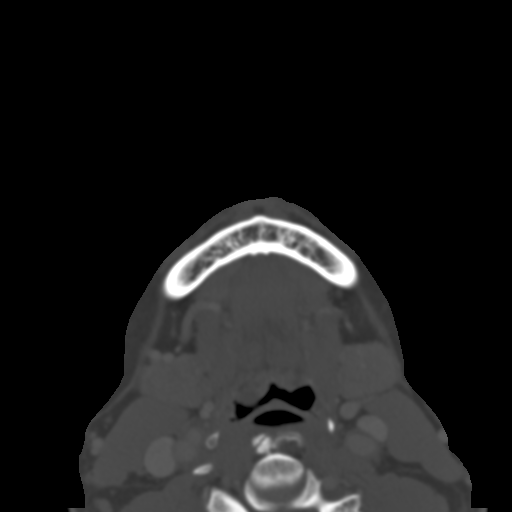
[im 25/90  bone]
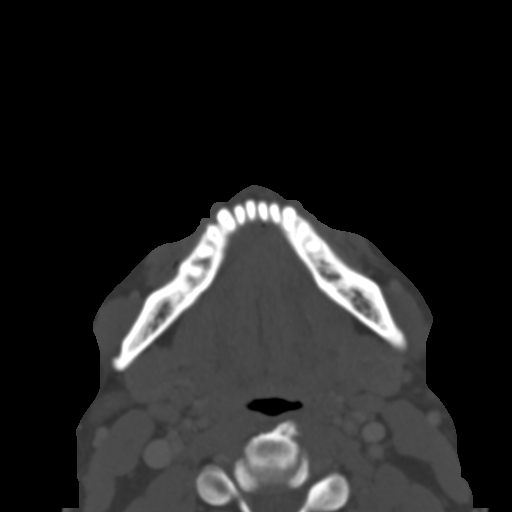
[im 34/90  bone]
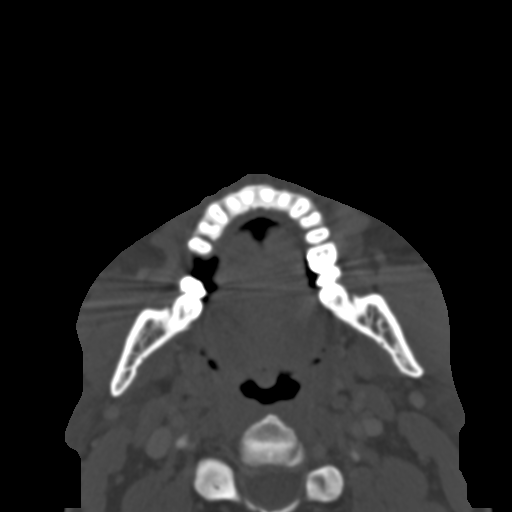
[im 47/90  brain]
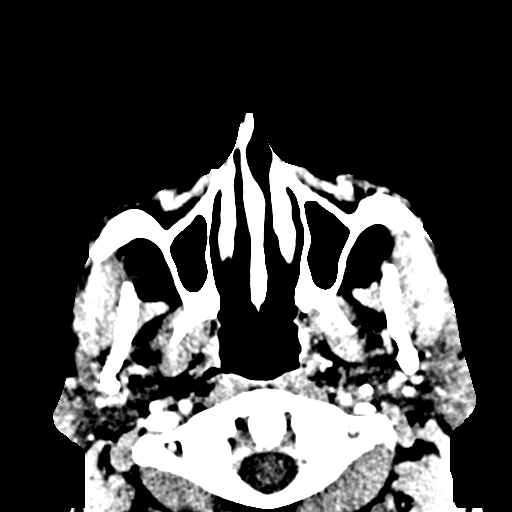
[im 47/90  bone]
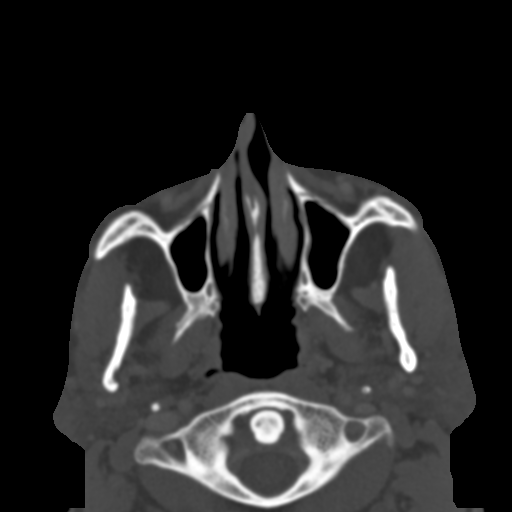
[im 56/90  bone]
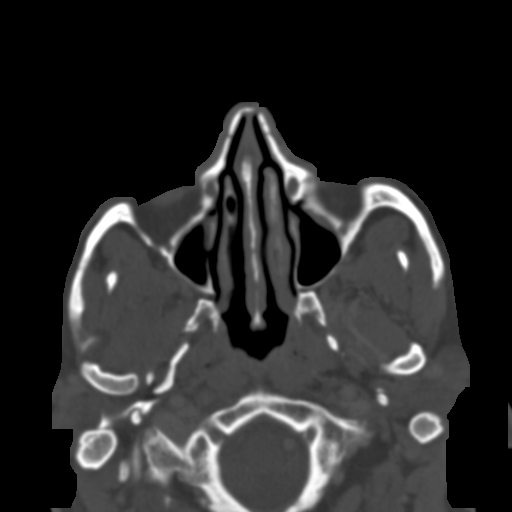
[im 65/90  bone]
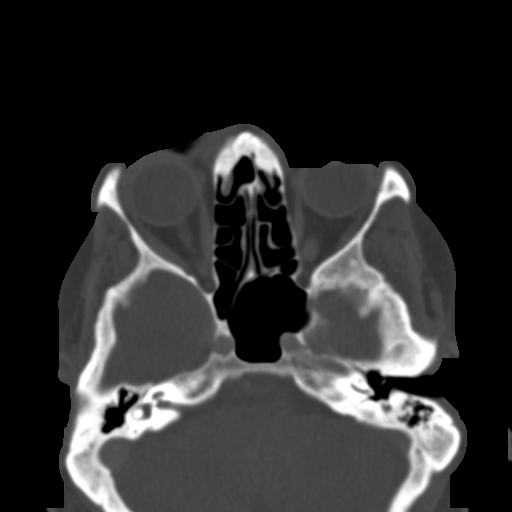
[im 74/90  bone]
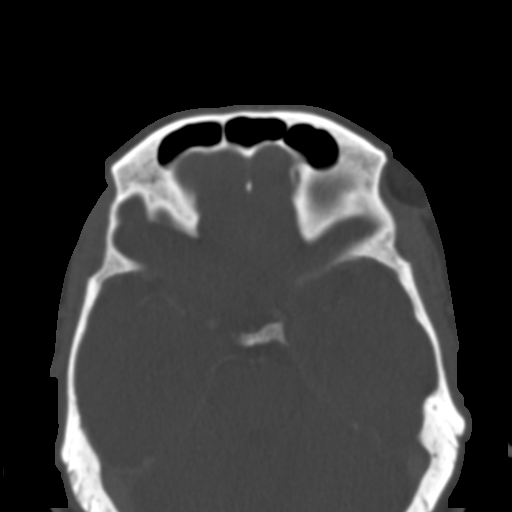
[im 83/90  brain]
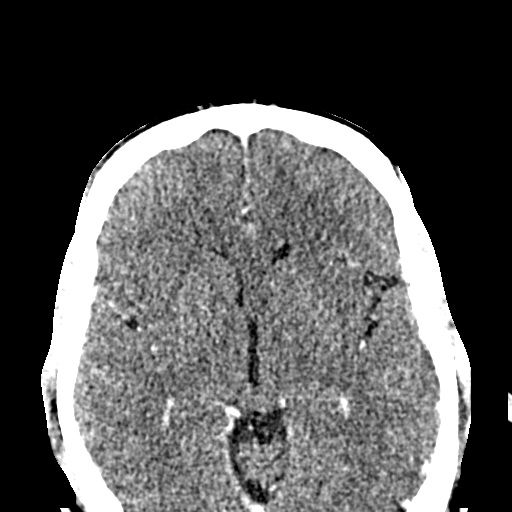
[im 83/90  bone]
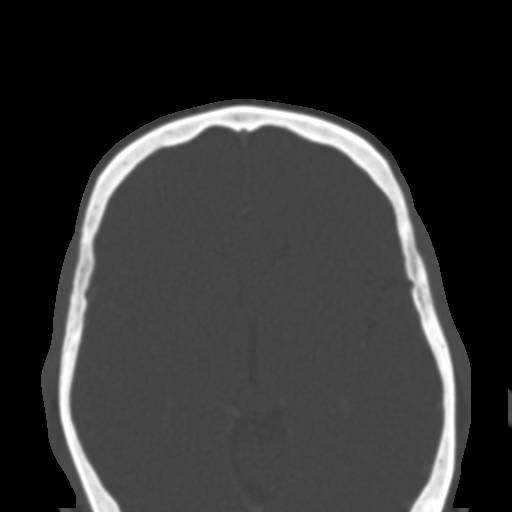

[Series 6: coronal soft · coronal · 0.36mm/px · 3 of 72 slices shown]
[im 24/72  bone]
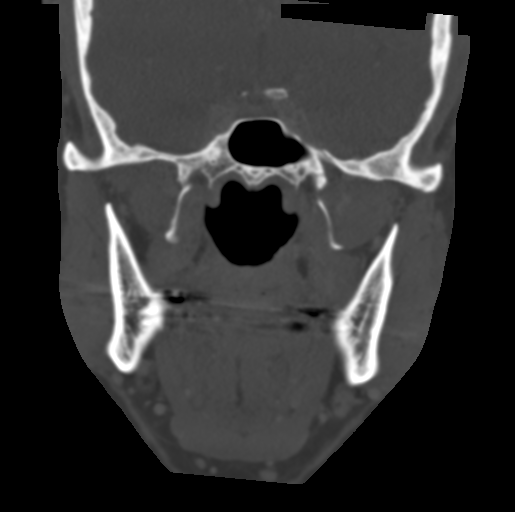
[im 32/72  bone]
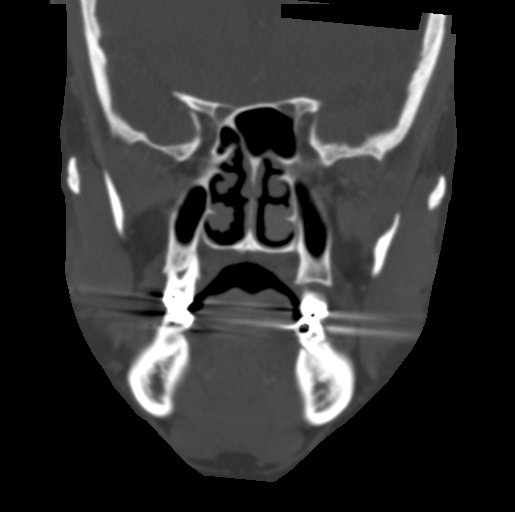
[im 40/72  bone]
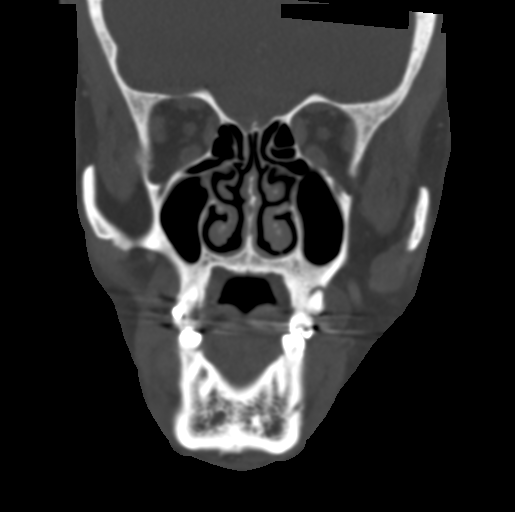

[Series 7: sagittal soft · sagittal · 0.28mm/px · 3 of 93 slices shown]
[im 31/93  bone]
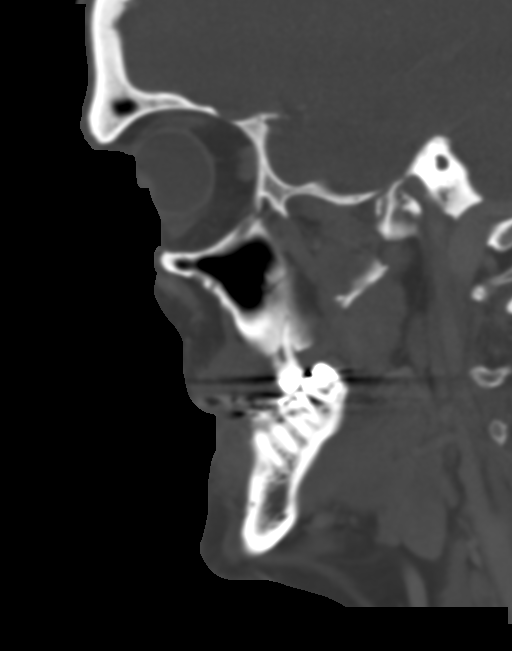
[im 47/93  bone]
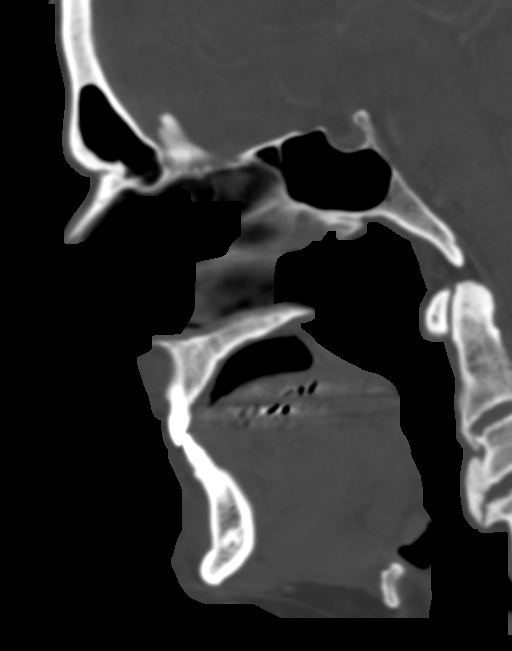
[im 62/93  bone]
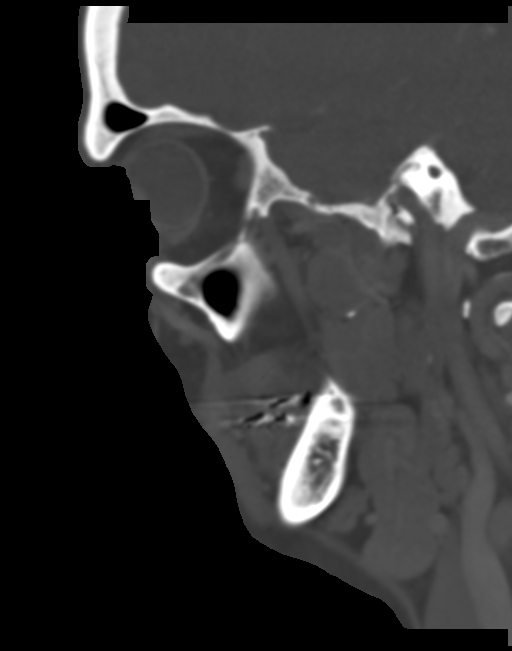

[15 of 47 positions shown; findings below may reference images not displayed]

RADIATION DOSE REDUCTION: This exam was performed according to the
departmental dose-optimization program which includes automated
exposure control, adjustment of the mA and/or kV according to
patient size and/or use of iterative reconstruction technique.

CONTRAST:  75mL OMNIPAQUE IOHEXOL 300 MG/ML  SOLN
FINDINGS: Osseous: No acute fracture or suspicious osseous lesion. Periapical
lucencies involving bilateral maxillary molar teeth.

Orbits: Unremarkable.

Sinuses: Trace mucosal thickening in the paranasal sinuses. Clear
mastoid air cells.

Soft tissues: There is a 2.6 x 1.6 x 1.9 cm subcutaneous mass
lateral and superior to the left orbit. The mass demonstrates mildly
heterogeneous fat density, and there is a small calcification along
the anterosuperior aspect of the mass. There is at most slight
scalloping of the underlying bone.

Limited intracranial: Unremarkable.
IMPRESSION: 2.6 cm fat containing mass lateral and superior to the left orbit
most compatible with a dermoid.

## 2023-02-02 DIAGNOSIS — S39011A Strain of muscle, fascia and tendon of abdomen, initial encounter: Secondary | ICD-10-CM | POA: Diagnosis not present

## 2023-02-02 DIAGNOSIS — F17210 Nicotine dependence, cigarettes, uncomplicated: Secondary | ICD-10-CM | POA: Diagnosis not present

## 2023-02-02 DIAGNOSIS — S76211A Strain of adductor muscle, fascia and tendon of right thigh, initial encounter: Secondary | ICD-10-CM | POA: Diagnosis not present

## 2023-02-02 DIAGNOSIS — E278 Other specified disorders of adrenal gland: Secondary | ICD-10-CM | POA: Diagnosis not present

## 2023-02-02 DIAGNOSIS — X58XXXA Exposure to other specified factors, initial encounter: Secondary | ICD-10-CM | POA: Diagnosis not present

## 2023-02-02 DIAGNOSIS — R1031 Right lower quadrant pain: Secondary | ICD-10-CM | POA: Diagnosis not present

## 2023-02-02 DIAGNOSIS — N281 Cyst of kidney, acquired: Secondary | ICD-10-CM | POA: Diagnosis not present
